# Patient Record
Sex: Female | Born: 1970 | Race: White | Hispanic: No | Marital: Single | Smoking: Never smoker
Health system: Southern US, Community
[De-identification: ages and names within clinical notes are randomized; demographics above are authoritative.]

## PROBLEM LIST (undated history)

## (undated) DIAGNOSIS — F419 Anxiety disorder, unspecified: Secondary | ICD-10-CM

## (undated) DIAGNOSIS — G43909 Migraine, unspecified, not intractable, without status migrainosus: Secondary | ICD-10-CM

## (undated) DIAGNOSIS — I959 Hypotension, unspecified: Secondary | ICD-10-CM

## (undated) DIAGNOSIS — I951 Orthostatic hypotension: Secondary | ICD-10-CM

## (undated) DIAGNOSIS — N898 Other specified noninflammatory disorders of vagina: Secondary | ICD-10-CM

---

## 2010-09-19 HISTORY — PX: HYSTERECTOMY ABDOMINAL WITH SALPINGECTOMY: SHX6725

## 2020-12-05 DIAGNOSIS — B349 Viral infection, unspecified: Secondary | ICD-10-CM | POA: Diagnosis not present

## 2020-12-07 ENCOUNTER — Other Ambulatory Visit (HOSPITAL_COMMUNITY): Payer: Self-pay

## 2020-12-07 DIAGNOSIS — J069 Acute upper respiratory infection, unspecified: Secondary | ICD-10-CM | POA: Diagnosis not present

## 2020-12-28 ENCOUNTER — Ambulatory Visit
Admission: EM | Admit: 2020-12-28 | Discharge: 2020-12-28 | Disposition: A | Discharge status: Home / Self Care | Payer: 59 | Attending: Emergency Medicine | Admitting: Emergency Medicine

## 2020-12-28 ENCOUNTER — Other Ambulatory Visit: Payer: Self-pay

## 2020-12-28 DIAGNOSIS — N3001 Acute cystitis with hematuria: Secondary | ICD-10-CM | POA: Insufficient documentation

## 2020-12-28 DIAGNOSIS — B349 Viral infection, unspecified: Secondary | ICD-10-CM | POA: Insufficient documentation

## 2020-12-28 DIAGNOSIS — R110 Nausea: Secondary | ICD-10-CM | POA: Diagnosis not present

## 2020-12-28 HISTORY — DX: Hypotension, unspecified: I95.9

## 2020-12-28 HISTORY — DX: Migraine, unspecified, not intractable, without status migrainosus: G43.909

## 2020-12-28 HISTORY — DX: Anxiety disorder, unspecified: F41.9

## 2020-12-28 LAB — POCT URINALYSIS DIP (MANUAL ENTRY)
Bilirubin, UA: NEGATIVE
Glucose, UA: NEGATIVE mg/dL
Ketones, POC UA: NEGATIVE mg/dL
Nitrite, UA: NEGATIVE
Protein Ur, POC: NEGATIVE mg/dL
Spec Grav, UA: 1.02 (ref 1.010–1.025)
Urobilinogen, UA: 0.2 E.U./dL
pH, UA: 6 (ref 5.0–8.0)

## 2020-12-28 MED ORDER — CEPHALEXIN 500 MG PO CAPS
500.0000 mg | ORAL_CAPSULE | Freq: Two times a day (BID) | ORAL | 0 refills | Status: AC
  Filled 2020-12-28: qty 10, 5d supply, fill #0

## 2020-12-28 MED ORDER — ALBUTEROL SULFATE HFA 108 (90 BASE) MCG/ACT IN AERS
1.0000 | INHALATION_SPRAY | Freq: Four times a day (QID) | RESPIRATORY_TRACT | 0 refills | Status: DC | PRN
  Filled 2020-12-28: qty 18, 25d supply, fill #0

## 2020-12-28 NOTE — ED Triage Notes (Signed)
Patient presents to Urgent Care with complaints of cough, headache, and nausea x 3 days. Pt states she had a fever 100.8 on Saturday. She states she did notice some hematuria on Thurs/Friday, increased urinary freq. Pt treating symptoms with Mucinex, ibuprofen, and Flonase.

## 2020-12-28 NOTE — Discharge Instructions (Signed)
Use the albuterol inhaler as directed.  Continue taking ibuprofen and Mucinex for your symptoms.    Take the antibiotic as directed.  The urine culture is pending.  We will call you if it shows the need to change or discontinue your antibiotic.    Take the antinausea medication as directed.    Keep yourself hydrated with clear liquids, such as water, Gatorade, Pedialyte, Sprite, or ginger ale.    Follow up with your primary care provider if your symptoms are not improving.

## 2020-12-28 NOTE — ED Provider Notes (Signed)
Caitlin Yu    CSN: [id number] Arrival date & time: 12/28/20  0805      History   Chief Complaint Chief Complaint  Patient presents with  . Cough  . Nausea    HPI Caitlin Yu is a 50 y.o. female.   Patient presents with 3-day history of headache, cough, nausea, hematuria, urinary frequency, low-grade fever.  T-max 100.8.  She denies dysuria, rash, sore throat, shortness of breath, vomiting, diarrhea, other symptoms.  Treatment attempted at home with ibuprofen, Mucinex, Flonase.  Patient was seen at [facility] on 12/05/2020 and diagnosed with acute viral syndrome.  She was seen there again on 12/07/2020; diagnosed with viral URI with cough; treated symptomatically with ibuprofen Zofran; treated with molnupiravir but patient states she did not take this because her COVID test was negative.  Her medical history includes migraines and anxiety.  The history is provided by the patient and medical records.    Past Medical History:  Diagnosis Date  . Anxiety   . Hypotension   . Migraine     There are no problems to display for this patient.   Past Surgical History:  Procedure Laterality Date  . HYSTERECTOMY ABDOMINAL WITH SALPINGECTOMY  2012    OB History   No obstetric history on file.      Home Medications    Prior to Admission medications   Medication Sig Start Date End Date Taking? Authorizing Provider  albuterol (VENTOLIN HFA) 108 (90 Base) MCG/ACT inhaler Inhale 1-2 puffs into the lungs every 6 (six) hours as needed for wheezing or shortness of breath. 12/28/20  Yes Sharion Balloon, NP  Butalbital-APAP-Caffeine [id number] MG capsule TAKE 1 CAPSULE BY MOUTH EVERY 4 HOURS AS NEEDED FOR PAIN 07/15/17  Yes [provider]  cephALEXin (KEFLEX) 500 MG capsule Take 1 capsule (500 mg total) by mouth 2 (two) times daily for 5 days. 12/28/20 01/02/21 Yes Sharion Balloon, NP  estradiol (ESTRACE) 1 MG tablet Take 1 tablet (1 mg total) by mouth once daily 10/21/19  Yes  [provider]  fluticasone (FLONASE) 50 MCG/ACT nasal spray Place into the nose. 06/30/17 10/03/21 Yes [provider]  lisdexamfetamine (VYVANSE) 10 MG capsule Take by mouth. 08/08/17  Yes [provider]  ondansetron (ZOFRAN) 4 MG tablet TAKE 1 TABLET BY MOUTH EVERY 8 HOURS AS NEEDED FOR NAUSEA FOR UP TO 7 DAYS 05/29/17  Yes [provider]  traZODone (DESYREL) 50 MG tablet Take by mouth. 06/19/19  Yes [provider]  Cholecalciferol 25 MCG (1000 UT) tablet Take by mouth.    [provider]  ibuprofen (ADVIL) 800 MG tablet TAKE 1 TABLET (800 MG TOTAL) BY MOUTH EVERY 6 (SIX) HOURS IF NEEDED FOR MILD PAIN FOR UP TO 10 DAYS. 12/07/20 12/07/21    midodrine (PROAMATINE) 5 MG tablet Take 5 mg by mouth 2 (two) times daily. 10/02/20   [provider]  ondansetron (ZOFRAN-ODT) 4 MG disintegrating tablet TAKE 1 TABLET (4 MG TOTAL) BY MOUTH EVERY 8 (EIGHT) HOURS IF NEEDED FOR NAUSEA OR VOMITING FOR UP TO 7 DAYS. 12/07/20 12/07/21      Family History Family History  Problem Relation Age of Onset  . Transient ischemic attack Mother   . Lung cancer Mother   . Neurofibromatosis Father     Social History Social History   Tobacco Use  . Smoking status: Never Smoker  . Smokeless tobacco: Never Used  Vaping Use  . Vaping Use: Never used  Substance Use Topics  .  Alcohol use: Yes    Alcohol/week: 3.0 standard drinks    Types: 3 Glasses of wine per week    Comment: per week   . Drug use: Never     Allergies   Cocamidopropyl betaine, Formaldehyde, Other, Urea, Zolpidem, Atomoxetine, and Chlorhexidine   Review of Systems Review of Systems  Constitutional: Positive for fever. Negative for chills.  HENT: Negative for ear pain and sore throat.   Eyes: Negative for pain and visual disturbance.  Respiratory: Positive for cough. Negative for shortness of breath.   Cardiovascular: Negative for chest pain and palpitations.  Gastrointestinal:  Positive for nausea. Negative for abdominal pain, diarrhea and vomiting.  Genitourinary: Positive for frequency and hematuria. Negative for dysuria.  Musculoskeletal: Negative for arthralgias and back pain.  Skin: Negative for color change and rash.  Neurological: Positive for headaches. Negative for seizures and syncope.  All other systems reviewed and are negative.    Physical Exam Triage Vital Signs ED Triage Vitals  Enc Vitals Group     BP      Pulse      Resp      Temp      Temp src      SpO2      Weight      Height      Head Circumference      Peak Flow      Pain Score      Pain Loc      Pain Edu?      Excl. in [place]?    No data found.  Updated Vital Signs BP 117/81 (BP Location: Left Arm)   Pulse 95   Temp 99.1 F (37.3 C) (Oral)   Resp 18   Wt 145 lb (65.8 kg)   SpO2 98%   Visual Acuity Right Eye Distance:   Left Eye Distance:   Bilateral Distance:    Right Eye Near:   Left Eye Near:    Bilateral Near:     Physical Exam Vitals and nursing note reviewed.  Constitutional:      General: She is not in acute distress.    Appearance: She is well-developed.  HENT:     Head: Normocephalic and atraumatic.     Right Ear: Tympanic membrane normal.     Left Ear: Tympanic membrane normal.     Nose: Nose normal.     Mouth/Throat:     Mouth: Mucous membranes are moist.     Pharynx: Oropharynx is clear.  Eyes:     Conjunctiva/sclera: Conjunctivae normal.  Cardiovascular:     Rate and Rhythm: Normal rate and regular rhythm.     Heart sounds: Normal heart sounds.  Pulmonary:     Effort: Pulmonary effort is normal. No respiratory distress.     Breath sounds: Normal breath sounds.  Abdominal:     General: Bowel sounds are normal.     Palpations: Abdomen is soft.     Tenderness: There is no abdominal tenderness. There is no right CVA tenderness, left CVA tenderness, guarding or rebound.  Musculoskeletal:     Cervical back: Neck supple.  Skin:    General:  Skin is warm and dry.     Findings: No rash.  Neurological:     General: No focal deficit present.     Mental Status: She is alert and oriented to person, place, and time.     Gait: Gait normal.  Psychiatric:        Mood and Affect: Mood  normal.        Behavior: Behavior normal.      UC Treatments / Results  Labs (all labs ordered are listed, but only abnormal results are displayed) Labs Reviewed  POCT URINALYSIS DIP (MANUAL ENTRY) - Abnormal; Notable for the following components:      Result Value   Blood, UA moderate (*)    Leukocytes, UA Small (1+) (*)    All other components within normal limits  URINE CULTURE    EKG   Radiology No results found.  Procedures Procedures (including critical care time)  Medications Ordered in UC Medications - No data to display  Initial Impression / Assessment and Plan / UC Course  I have reviewed the triage vital signs and the nursing notes.  Pertinent labs & imaging results that were available during my care of the patient were reviewed by me and considered in my medical decision making (see chart for details).   Viral illness, acute cystitis with hematuria, nausea without vomiting.  Treating viral symptoms with albuterol inhaler, ibuprofen, Mucinex.  Urine culture pending.  Treating with Keflex.  Discussed with patient that we will call her if the culture shows the need to change or discontinue the antibiotic.  Patient already has Zofran at home and is using this for her nausea.  Instructed her to keep yourself hydrated with clear liquids.  Discussed that she should follow-up with her PCP if her symptoms are not improving.  Patient states she will go through employee health to get a COVID test if required.  She agrees to plan of care.   Final Clinical Impressions(s) / UC Diagnoses   Final diagnoses:  Viral illness  Acute cystitis with hematuria  Nausea without vomiting     Discharge Instructions     Use the albuterol  inhaler as directed.  Continue taking ibuprofen and Mucinex for your symptoms.    Take the antibiotic as directed.  The urine culture is pending.  We will call you if it shows the need to change or discontinue your antibiotic.    Take the antinausea medication as directed.    Keep yourself hydrated with clear liquids, such as water, Gatorade, Pedialyte, Sprite, or ginger ale.    Follow up with your primary care provider if your symptoms are not improving.        ED Prescriptions    Medication Sig Dispense Auth. Provider   albuterol (VENTOLIN HFA) 108 (90 Base) MCG/ACT inhaler Inhale 1-2 puffs into the lungs every 6 (six) hours as needed for wheezing or shortness of breath. 18 g Sharion Balloon, NP   cephALEXin (KEFLEX) 500 MG capsule Take 1 capsule (500 mg total) by mouth 2 (two) times daily for 5 days. 10 capsule Sharion Balloon, NP     PDMP not reviewed this encounter.   Sharion Balloon, NP 12/28/20 [phone]

## 2020-12-29 ENCOUNTER — Emergency Department
Admission: EM | Admit: 2020-12-29 | Discharge: 2020-12-29 | Disposition: A | Discharge status: Home / Self Care | Payer: 59 | Attending: Emergency Medicine | Admitting: Emergency Medicine

## 2020-12-29 ENCOUNTER — Emergency Department: Payer: 59

## 2020-12-29 ENCOUNTER — Ambulatory Visit: Payer: Self-pay | Admitting: *Deleted

## 2020-12-29 ENCOUNTER — Other Ambulatory Visit: Payer: Self-pay

## 2020-12-29 ENCOUNTER — Encounter: Payer: Self-pay | Admitting: *Deleted

## 2020-12-29 DIAGNOSIS — R531 Weakness: Secondary | ICD-10-CM | POA: Diagnosis not present

## 2020-12-29 DIAGNOSIS — Z20822 Contact with and (suspected) exposure to covid-19: Secondary | ICD-10-CM | POA: Diagnosis not present

## 2020-12-29 DIAGNOSIS — R0602 Shortness of breath: Secondary | ICD-10-CM | POA: Diagnosis not present

## 2020-12-29 DIAGNOSIS — J40 Bronchitis, not specified as acute or chronic: Secondary | ICD-10-CM | POA: Diagnosis not present

## 2020-12-29 LAB — BASIC METABOLIC PANEL
Anion gap: 11 (ref 5–15)
BUN: 14 mg/dL (ref 6–20)
CO2: 22 mmol/L (ref 22–32)
Calcium: 9.8 mg/dL (ref 8.9–10.3)
Chloride: 105 mmol/L (ref 98–111)
Creatinine, Ser: 0.91 mg/dL (ref 0.44–1.00)
GFR, Estimated: >60 mL/min (ref >60)
Glucose, Bld: 95 mg/dL (ref 70–99)
Potassium: 3.6 mmol/L (ref 3.5–5.1)
Sodium: 138 mmol/L (ref 135–145)

## 2020-12-29 LAB — CBC
HCT: 39.9 % (ref 36.0–46.0)
Hemoglobin: 14 g/dL (ref 12.0–15.0)
MCH: 31.2 pg (ref 26.0–34.0)
MCHC: 35.1 g/dL (ref 30.0–36.0)
MCV: 88.9 fL (ref 80.0–100.0)
Platelets: 243 10*3/uL (ref 150–400)
RBC: 4.49 MIL/uL (ref 3.87–5.11)
RDW: 12.2 % (ref 11.5–15.5)
WBC: 4.6 10*3/uL (ref 4.0–10.5)
nRBC: 0 % (ref 0.0–0.2)

## 2020-12-29 LAB — TROPONIN I (HIGH SENSITIVITY): Troponin I (High Sensitivity): 3 ng/L (ref <18)

## 2020-12-29 MED ORDER — ALBUTEROL SULFATE HFA 108 (90 BASE) MCG/ACT IN AERS
2.0000 | INHALATION_SPRAY | Freq: Four times a day (QID) | RESPIRATORY_TRACT | 0 refills | Status: DC | PRN
  Filled 2020-12-29 – 2021-05-25 (×2): qty 18, 25d supply, fill #0

## 2020-12-29 MED ORDER — IPRATROPIUM-ALBUTEROL 0.5-2.5 (3) MG/3ML IN SOLN
3.0000 mL | Freq: Once | RESPIRATORY_TRACT | Status: AC
  Administered 2020-12-29: 3 mL via RESPIRATORY_TRACT
  Filled 2020-12-29: qty 3

## 2020-12-29 MED ORDER — ONDANSETRON 4 MG PO TBDP
4.0000 mg | ORAL_TABLET | Freq: Once | ORAL | Status: AC
  Administered 2020-12-29: 4 mg via ORAL
  Filled 2020-12-29: qty 1

## 2020-12-29 NOTE — ED Triage Notes (Signed)
Pt reports sob for 5 days.  Pt was seen at urgent care yesterday treated for viral infection.  Pt has a cough.  Nonsmoker.  No chest pain.  Pt reports nausea.  No v/d  Pt alert

## 2020-12-29 NOTE — Telephone Encounter (Signed)
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 She had viral illness with negative /suspect COVID 2-3 weeks ago- she is now sick again. Patient is calling to schedule NP appointment- she is very out of breath.  Patient advised ED now- call 911 if unsafe to drive.  Reason for Disposition . [1] MODERATE difficulty breathing (e.g., speaks in phrases, SOB even at rest, pulse 100-120) AND [2] NEW-onset or WORSE than normal  Answer Assessment - Initial Assessment Questions 1. RESPIRATORY STATUS: "Describe your breathing?" (e.g., wheezing, shortness of breath, unable to speak, severe coughing)      Patient sounds out of breath 2. ONSET: "When did this breathing problem begin?"      2-3 weeks ago- 2 episodes of -bronchitis/asthma 3. PATTERN "Does the difficult breathing come and go, or has it been constant since it started?"      constant 4. SEVERITY: "How bad is your breathing?" (e.g., mild, moderate, severe)    - MILD: No SOB at rest, mild SOB with walking, speaks normally in sentences, can lay down, no retractions, pulse < 100.    - MODERATE: SOB at rest, SOB with minimal exertion and prefers to sit, cannot lie down flat, speaks in phrases, mild retractions, audible wheezing, pulse 100-120.    - SEVERE: Very SOB at rest, speaks in single words, struggling to breathe, sitting hunched forward, retractions, pulse > 120      moderate 5. RECURRENT SYMPTOM: "Have you had difficulty breathing before?" If Yes, ask: "When was the last time?" and "What happened that time?"      Hx asthma/bronchitis- treatment with inhaler 6. CARDIAC HISTORY: "Do you have any history of heart disease?" (e.g., heart attack, angina, bypass surgery, angioplasty)      no 7. LUNG HISTORY: "Do you have any history of lung disease?"  (e.g., pulmonary embolus, asthma, emphysema)     Hx asthma 8. CAUSE: "What do you think is causing the breathing problem?"      Unsure- no test at UC 9. OTHER  SYMPTOMS: "Do you have any other symptoms? (e.g., dizziness, runny nose, cough, chest pain, fever)     dizziness 10. PREGNANCY: "Is there any chance you are pregnant?" "When was your last menstrual period?"       n/a 11. TRAVEL: "Have you traveled out of the country in the last month?" (e.g., travel history, exposures)       Works at hospital  Protocols used: BREATHING DIFFICULTY-A-AH

## 2020-12-29 NOTE — ED Provider Notes (Signed)
[facility] Provider Note   ____________________________________________   Event Date/Time   First MD Initiated Contact with Patient 12/29/20 1540     (approximate)  I have reviewed the triage vital signs and the nursing notes.   HISTORY  Chief Complaint Shortness of Breath    HPI Caitlin Yu is a 50 y.o. female with a past medical history of anxiety, migraines, and hypotension on midodrine who presents to the ED complaining of shortness of breath.  Patient reports that for the past 5 days she has had increasing dry cough, nausea, generalized weakness, difficulty catching her breath.  She had fevers as high as 101 at the start of her illness, but denies any fevers in the past 24 hours.  She has not had any pain in her chest and denies any vomiting, diarrhea, abdominal pain, dysuria, or flank pain.  She was seen at urgent care yesterday and diagnosed with "viral infection" and she states that she is waiting to hear the results back from her Covid test.  She has been using her inhaler without significant relief.        Past Medical History:  Diagnosis Date  . Anxiety   . Hypotension   . Migraine     There are no problems to display for this patient.   Past Surgical History:  Procedure Laterality Date  . HYSTERECTOMY ABDOMINAL WITH SALPINGECTOMY  2012    Prior to Admission medications   Medication Sig Start Date End Date Taking? Authorizing Provider  albuterol (VENTOLIN HFA) 108 (90 Base) MCG/ACT inhaler Inhale 2 puffs into the lungs every 6 (six) hours as needed for wheezing or shortness of breath. 12/29/20  Yes Blake Divine, MD  Butalbital-APAP-Caffeine [phone] MG capsule TAKE 1 CAPSULE BY MOUTH EVERY 4 HOURS AS NEEDED FOR PAIN 07/15/17   [provider]  cephALEXin (KEFLEX) 500 MG capsule Take 1 capsule (500 mg total) by mouth 2 (two) times daily for 5 days. 12/28/20 01/02/21  Sharion Balloon, NP  Cholecalciferol 25  MCG (1000 UT) tablet Take by mouth.    [provider]  estradiol (ESTRACE) 1 MG tablet Take 1 tablet (1 mg total) by mouth once daily 10/21/19   [provider]  fluticasone (FLONASE) 50 MCG/ACT nasal spray Place into the nose. 06/30/17 10/03/21  [provider]  ibuprofen (ADVIL) 800 MG tablet TAKE 1 TABLET (800 MG TOTAL) BY MOUTH EVERY 6 (SIX) HOURS IF NEEDED FOR MILD PAIN FOR UP TO 10 DAYS. 12/07/20 12/07/21    lisdexamfetamine (VYVANSE) 10 MG capsule Take by mouth. 08/08/17   [provider]  midodrine (PROAMATINE) 5 MG tablet Take 5 mg by mouth 2 (two) times daily. 10/02/20   [provider]  ondansetron (ZOFRAN) 4 MG tablet TAKE 1 TABLET BY MOUTH EVERY 8 HOURS AS NEEDED FOR NAUSEA FOR UP TO 7 DAYS 05/29/17   [provider]  ondansetron (ZOFRAN-ODT) 4 MG disintegrating tablet TAKE 1 TABLET (4 MG TOTAL) BY MOUTH EVERY 8 (EIGHT) HOURS IF NEEDED FOR NAUSEA OR VOMITING FOR UP TO 7 DAYS. 12/07/20 12/07/21    traZODone (DESYREL) 50 MG tablet Take by mouth. 06/19/19   [provider]    Allergies Cocamidopropyl betaine, Formaldehyde, Other, Urea, Zolpidem, Atomoxetine, and Chlorhexidine  Family History  Problem Relation Age of Onset  . Transient ischemic attack Mother   . Lung cancer Mother   . Neurofibromatosis Father     Social History Social History   Tobacco Use  .  Smoking status: Never Smoker  . Smokeless tobacco: Never Used  Vaping Use  . Vaping Use: Never used  Substance Use Topics  . Alcohol use: Yes    Alcohol/week: 3.0 standard drinks    Types: 3 Glasses of wine per week    Comment: per week   . Drug use: Never    Review of Systems  Constitutional: Positive for fever and generalized weakness. Eyes: No visual changes. ENT: No sore throat. Cardiovascular: Denies chest pain. Respiratory: Positive for cough and shortness of breath. Gastrointestinal: No abdominal pain.  Positive for nausea, no vomiting.  No  diarrhea.  No constipation. Genitourinary: Negative for dysuria. Musculoskeletal: Negative for back pain. Skin: Negative for rash. Neurological: Negative for headaches, focal weakness or numbness.  ____________________________________________   PHYSICAL EXAM:  VITAL SIGNS: ED Triage Vitals [12/29/20 1527]  Enc Vitals Group     BP 126/82     Pulse Rate 90     Resp (!) 24     Temp 98.2 F (36.8 C)     Temp Source Oral     SpO2 100 %     Weight 145 lb (65.8 kg)     Height 5\' 7"  (1.702 m)     Head Circumference      Peak Flow      Pain Score 0     Pain Loc      Pain Edu?      Excl. in [place]?     Constitutional: Alert and oriented. Eyes: Conjunctivae are normal. Head: Atraumatic. Nose: No congestion/rhinnorhea. Mouth/Throat: Mucous membranes are moist.  Hoarse voice noted. Neck: Normal ROM Cardiovascular: Normal rate, regular rhythm. Grossly normal heart sounds. Respiratory: Normal respiratory effort.  No retractions. Lungs CTAB. Gastrointestinal: Soft and nontender. No distention. Genitourinary: deferred Musculoskeletal: No lower extremity tenderness nor edema. Neurologic:  Normal speech and language. No gross focal neurologic deficits are appreciated. Skin:  Skin is warm, dry and intact. No rash noted. Psychiatric: Mood and affect are normal. Speech and behavior are normal.  ____________________________________________   LABS (all labs ordered are listed, but only abnormal results are displayed)  Labs Reviewed  BASIC METABOLIC PANEL  CBC  TROPONIN I (HIGH SENSITIVITY)   ____________________________________________  EKG  ED ECG REPORT I, Blake Divine, the attending physician, personally viewed and interpreted this ECG.   Date: 12/29/2020  EKG Time: 15:30  Rate: 88  Rhythm: normal sinus rhythm  Axis: Normal  Intervals:none  ST&T Change: None   PROCEDURES  Procedure(s) performed (including Critical  Care):  Procedures   ____________________________________________   INITIAL IMPRESSION / ASSESSMENT AND PLAN / ED COURSE       50 year old female with past medical history of anxiety, migraines, and hypertension who presents to the ED with cough, nausea, malaise, and increasing difficulty catching her breath over the past 4 to 5 days.  Patient is overall well-appearing and not in any respiratory distress, lungs clear to auscultation bilaterally.  Chest x-ray reviewed by me and shows no infiltrate, edema, or effusion.  EKG shows no evidence of arrhythmia or ischemia and I doubt ACS or PE as she is PERC negative.  We will treat symptomatically with Zofran and DuoNeb, but I suspect her symptoms are due to viral illness and bronchitis.  Lab work is unremarkable, troponin within normal limits.  I doubt ACS or PE given her symptoms are consistent with a viral infection.  Patient reports feeling better following DuoNeb and she is appropriate for discharge home with PCP follow-up.  She states she has Zofran available at home but is requesting refill on her inhaler.  She was counseled to return to the ED for new worsening symptoms, patient agrees with plan.      ____________________________________________   FINAL CLINICAL IMPRESSION(S) / ED DIAGNOSES  Final diagnoses:  Bronchitis     ED Discharge Orders         Ordered    albuterol (VENTOLIN HFA) 108 (90 Base) MCG/ACT inhaler  Every 6 hours PRN       Note to Pharmacy: Please supply with spacer   12/29/20 1729           Note:  This document was prepared using Dragon voice recognition software and may include unintentional dictation errors.   Blake Divine, MD 12/29/20 1731

## 2020-12-30 ENCOUNTER — Other Ambulatory Visit: Payer: Self-pay

## 2020-12-30 DIAGNOSIS — J209 Acute bronchitis, unspecified: Secondary | ICD-10-CM | POA: Diagnosis not present

## 2020-12-30 DIAGNOSIS — B9689 Other specified bacterial agents as the cause of diseases classified elsewhere: Secondary | ICD-10-CM | POA: Diagnosis not present

## 2020-12-30 DIAGNOSIS — J019 Acute sinusitis, unspecified: Secondary | ICD-10-CM | POA: Diagnosis not present

## 2020-12-30 LAB — URINE CULTURE

## 2020-12-30 LAB — SARS CORONAVIRUS 2 (TAT 6-24 HRS): SARS Coronavirus 2: NEGATIVE

## 2020-12-31 ENCOUNTER — Other Ambulatory Visit: Payer: Self-pay

## 2020-12-31 MED ORDER — AZITHROMYCIN 250 MG PO TABS
ORAL_TABLET | ORAL | 0 refills | Status: DC
  Filled 2020-12-31: qty 6, 5d supply, fill #0

## 2020-12-31 MED ORDER — METHYLPREDNISOLONE 4 MG PO TBPK
ORAL_TABLET | ORAL | 0 refills | Status: AC
  Filled 2020-12-31: qty 21, 6d supply, fill #0

## 2020-12-31 MED ORDER — BENZONATATE 200 MG PO CAPS
ORAL_CAPSULE | ORAL | 0 refills | Status: DC
  Filled 2020-12-31: qty 20, 7d supply, fill #0

## 2021-01-05 DIAGNOSIS — Z20822 Contact with and (suspected) exposure to covid-19: Secondary | ICD-10-CM | POA: Diagnosis not present

## 2021-01-05 DIAGNOSIS — Z03818 Encounter for observation for suspected exposure to other biological agents ruled out: Secondary | ICD-10-CM | POA: Diagnosis not present

## 2021-01-05 DIAGNOSIS — R499 Unspecified voice and resonance disorder: Secondary | ICD-10-CM | POA: Diagnosis not present

## 2021-01-11 DIAGNOSIS — R490 Dysphonia: Secondary | ICD-10-CM | POA: Diagnosis not present

## 2021-01-14 ENCOUNTER — Ambulatory Visit: Payer: 59 | Admitting: Internal Medicine

## 2021-01-15 ENCOUNTER — Other Ambulatory Visit: Payer: Self-pay

## 2021-01-15 DIAGNOSIS — I951 Orthostatic hypotension: Secondary | ICD-10-CM | POA: Diagnosis not present

## 2021-01-15 DIAGNOSIS — R110 Nausea: Secondary | ICD-10-CM | POA: Diagnosis not present

## 2021-01-15 DIAGNOSIS — F5102 Adjustment insomnia: Secondary | ICD-10-CM | POA: Diagnosis not present

## 2021-01-15 DIAGNOSIS — F418 Other specified anxiety disorders: Secondary | ICD-10-CM | POA: Diagnosis not present

## 2021-01-15 MED ORDER — TRAZODONE HCL 50 MG PO TABS
ORAL_TABLET | ORAL | 11 refills | Status: DC
  Filled 2021-01-15: qty 30, 30d supply, fill #0

## 2021-01-15 MED ORDER — SERTRALINE HCL 50 MG PO TABS
1.0000 | ORAL_TABLET | Freq: Every day | ORAL | 11 refills | Status: DC
  Filled 2021-01-15: qty 30, 30d supply, fill #0
  Filled 2021-02-22: qty 30, 30d supply, fill #1

## 2021-01-15 MED ORDER — MIDODRINE HCL 5 MG PO TABS
5.0000 mg | ORAL_TABLET | Freq: Two times a day (BID) | ORAL | 3 refills | Status: DC
  Filled 2021-01-15: qty 180, 90d supply, fill #0
  Filled 2021-07-26: qty 180, 90d supply, fill #1

## 2021-01-18 ENCOUNTER — Other Ambulatory Visit: Payer: Self-pay

## 2021-01-20 ENCOUNTER — Other Ambulatory Visit: Payer: Self-pay

## 2021-01-27 DIAGNOSIS — R300 Dysuria: Secondary | ICD-10-CM | POA: Diagnosis not present

## 2021-01-27 DIAGNOSIS — N390 Urinary tract infection, site not specified: Secondary | ICD-10-CM | POA: Diagnosis not present

## 2021-02-03 ENCOUNTER — Other Ambulatory Visit: Payer: Self-pay | Admitting: Family Medicine

## 2021-02-03 ENCOUNTER — Ambulatory Visit
Admission: RE | Admit: 2021-02-03 | Discharge: 2021-02-03 | Disposition: A | Discharge status: Home / Self Care | Payer: 59 | Source: Ambulatory Visit | Attending: Family Medicine | Admitting: Family Medicine

## 2021-02-03 ENCOUNTER — Other Ambulatory Visit: Payer: Self-pay

## 2021-02-03 DIAGNOSIS — R1032 Left lower quadrant pain: Secondary | ICD-10-CM | POA: Diagnosis not present

## 2021-02-03 DIAGNOSIS — R310 Gross hematuria: Secondary | ICD-10-CM | POA: Diagnosis not present

## 2021-02-03 DIAGNOSIS — N200 Calculus of kidney: Secondary | ICD-10-CM | POA: Diagnosis not present

## 2021-02-03 DIAGNOSIS — D1771 Benign lipomatous neoplasm of kidney: Secondary | ICD-10-CM | POA: Diagnosis not present

## 2021-02-03 DIAGNOSIS — N2889 Other specified disorders of kidney and ureter: Secondary | ICD-10-CM | POA: Diagnosis not present

## 2021-02-03 DIAGNOSIS — R109 Unspecified abdominal pain: Secondary | ICD-10-CM | POA: Diagnosis not present

## 2021-02-04 ENCOUNTER — Ambulatory Visit (INDEPENDENT_AMBULATORY_CARE_PROVIDER_SITE_OTHER): Payer: 59 | Admitting: Urology

## 2021-02-04 ENCOUNTER — Encounter: Payer: Self-pay | Admitting: Urology

## 2021-02-04 VITALS — BP 114/72 | HR 55 | Ht 67.0 in | Wt 141.0 lb

## 2021-02-04 DIAGNOSIS — R310 Gross hematuria: Secondary | ICD-10-CM

## 2021-02-04 DIAGNOSIS — R8281 Pyuria: Secondary | ICD-10-CM | POA: Diagnosis not present

## 2021-02-04 DIAGNOSIS — N200 Calculus of kidney: Secondary | ICD-10-CM

## 2021-02-04 NOTE — Progress Notes (Signed)
02/04/2021 2:52 PM   Caitlin Yu March 14, 1971 [id number]  Referring provider: Leonel Ramsay, MD [place],  [place] [address]  Chief Complaint  Patient presents with  . Nephrolithiasis    HPI: Caitlin Yu is a 50 y.o. female referred for evaluation of nephrolithiasis.   Was seen at [facility] on 01/27/2021 with complaints of mild, intermittent dysuria and brownish colored urine with mild suprapubic pain  Slight increased frequency and urgency  No fever, chills, nausea or vomiting  Urinalysis showed >50 RBCs and 0 WBCs  She was started on cefdinir however urine culture was negative  She develop worsening gross hematuria yesterday describing her urine is red in color and had mild left lower quadrant abdominal pain; repeat urinalysis with 10-50 RBCs and no WBCs  Stone protocol CT abdomen pelvis was performed which showed a nonobstructing 4 mm right lower pole calculus and 2 1-2 millimeter nonobstructing left renal calculi.  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 Anxiety   . Hypotension   . Migraine     Surgical History: Past Surgical History:  Procedure Laterality Date  . HYSTERECTOMY ABDOMINAL WITH SALPINGECTOMY  2012    Home Medications:  Allergies as of 02/04/2021      Reactions   Cocamidopropyl Betaine Other (See Comments)   Other reaction(s): Other (See Comments) Positive patch test Other reaction(s): Other (See Comments) Other reaction(s): Other (See  Comments) Positive patch test Positive patch test Other reaction(s): Other (See Comments) Positive patch test Other reaction(s): Other (See Comments) Other reaction(s): Other (See Comments) Positive patch test Positive patch test Positive patch test Positive patch test   Formaldehyde Other (See Comments)   Other reaction(s): Other (See Comments) Positive patch test Other reaction(s): Other (See Comments) Other reaction(s): Other (See Comments) Positive patch test Positive patch test Other reaction(s): Other (See Comments) Positive patch test Other reaction(s): Other (See Comments) Other reaction(s): Other (See Comments) Positive patch test Positive patch test Positive patch test Positive patch test   Other Other (See Comments)   Other reaction(s): Other (See Comments) Para-phenylenediamine = Positive patch test amidoamine = Positive patch test Para-phenylenediamine = Positive patch test amidoamine = Positive patch test   Urea Other (See Comments)   Other reaction(s): Other (See Comments) Positive patch test Other reaction(s): Other (See Comments) Other reaction(s): Other (See Comments) Positive patch test Positive patch test Other reaction(s): Other (See Comments) Positive patch test Other reaction(s): Other (See Comments) Other reaction(s): Other (See Comments) Positive patch test Positive patch test Positive patch test Positive patch test   Zolpidem Other (See Comments)   Sleep Walking Other reaction(s): Other (See Comments), Other (See Comments) Sleep Walking Sleep Walking Other reaction(s): Other (See Comments) Sleep Walking Other reaction(s): Other (See Comments), Other (See Comments) Sleep Walking Sleep Walking Sleep Walking Sleep Walking   Atomoxetine Palpitations   Chlorhexidine Rash   allergic contact dermatitis allergic contact dermatitis allergic contact dermatitis allergic contact dermatitis allergic contact dermatitis allergic contact  dermatitis allergic  contact dermatitis allergic contact dermatitis      Medication List       Accurate as of Feb 04, 2021  2:52 PM. If you have any questions, ask your nurse or doctor.        albuterol 108 (90 Base) MCG/ACT inhaler Commonly known as: VENTOLIN HFA Inhale 2 puffs into the lungs every 6 (six) hours as needed for wheezing or shortness of breath.   azithromycin 250 MG tablet Commonly known as: ZITHROMAX take 2 tablets by mouth today, then one tablet daily for 4 days (Two tabs today, then one daily)   benzonatate 200 MG capsule Commonly known as: TESSALON Take 1 capsule (200 mg total) by mouth 3 (three) times daily as needed for Cough for up to 7 days   Butalbital-APAP-Caffeine 50-325-40 MG capsule TAKE 1 CAPSULE BY MOUTH EVERY 4 HOURS AS NEEDED FOR PAIN   Cholecalciferol 25 MCG (1000 UT) tablet Take by mouth.   estradiol 1 MG tablet Commonly known as: ESTRACE Take 1 tablet (1 mg total) by mouth once daily   fluticasone 50 MCG/ACT nasal spray Commonly known as: FLONASE Place into the nose.   ibuprofen 800 MG tablet Commonly known as: ADVIL TAKE 1 TABLET (800 MG TOTAL) BY MOUTH EVERY 6 (SIX) HOURS IF NEEDED FOR MILD PAIN FOR UP TO 10 DAYS.   lisdexamfetamine 10 MG capsule Commonly known as: VYVANSE Take by mouth.   methylPREDNISolone 4 MG Tbpk tablet Commonly known as: MEDROL DOSEPAK Follow package directions.   midodrine 5 MG tablet Commonly known as: PROAMATINE Take 5 mg by mouth 2 (two) times daily.   midodrine 5 MG tablet Commonly known as: PROAMATINE Take 1 tablet (5 mg total) by mouth 2 (two) times daily   ondansetron 4 MG disintegrating tablet Commonly known as: ZOFRAN-ODT TAKE 1 TABLET (4 MG TOTAL) BY MOUTH EVERY 8 (EIGHT) HOURS IF NEEDED FOR NAUSEA OR VOMITING FOR UP TO 7 DAYS.   ondansetron 4 MG tablet Commonly known as: ZOFRAN TAKE 1 TABLET BY MOUTH EVERY 8 HOURS AS NEEDED FOR NAUSEA FOR UP TO 7 DAYS   sertraline 50 MG  tablet Commonly known as: ZOLOFT Take 1 tablet (50 mg total) by mouth once daily   traZODone 50 MG tablet Commonly known as: DESYREL Take by mouth.   traZODone 50 MG tablet Commonly known as: DESYREL Take 1 tablet (50 mg total) by mouth nightly       Allergies:  Allergies  Allergen Reactions  . Cocamidopropyl Betaine Other (See Comments)    Other reaction(s): Other (See Comments) Positive patch test Other reaction(s): Other (See Comments) Other reaction(s): Other (See Comments) Positive patch test Positive patch test Other reaction(s): Other (See Comments) Positive patch test Other reaction(s): Other (See Comments) Other reaction(s): Other (See Comments) Positive patch test Positive patch test Positive patch test Positive patch test   . Formaldehyde Other (See Comments)    Other reaction(s): Other (See Comments) Positive patch test Other reaction(s): Other (See Comments) Other reaction(s): Other (See Comments) Positive patch test Positive patch test Other reaction(s): Other (See Comments) Positive patch test Other reaction(s): Other (See Comments) Other reaction(s): Other (See Comments) Positive patch test Positive patch test Positive patch test Positive patch test   . Other Other (See Comments)    Other reaction(s): Other (See Comments) Para-phenylenediamine = Positive patch test amidoamine = Positive patch test Para-phenylenediamine = Positive patch test amidoamine = Positive patch test   . Urea Other (See Comments)    Other reaction(s): Other (  See Comments) Positive patch test Other reaction(s): Other (See Comments) Other reaction(s): Other (See Comments) Positive patch test Positive patch test Other reaction(s): Other (See Comments) Positive patch test Other reaction(s): Other (See Comments) Other reaction(s): Other (See Comments) Positive patch test Positive patch test Positive patch test Positive patch test   . Zolpidem Other (See  Comments)    Sleep Walking Other reaction(s): Other (See Comments), Other (See Comments) Sleep Walking Sleep Walking Other reaction(s): Other (See Comments) Sleep Walking Other reaction(s): Other (See Comments), Other (See Comments) Sleep Walking Sleep Walking Sleep Walking Sleep Walking   . Atomoxetine Palpitations  . Chlorhexidine Rash    allergic contact dermatitis allergic contact dermatitis allergic contact dermatitis allergic contact dermatitis allergic contact dermatitis allergic contact dermatitis allergic contact dermatitis allergic contact dermatitis     Family History: Family History  Problem Relation Age of Onset  . Transient ischemic attack Mother   . Lung cancer Mother   . Neurofibromatosis Father     Social History:  reports that she has never smoked. She has never used smokeless tobacco. She reports current alcohol use of about 3.0 standard drinks of alcohol per week. She reports that she does not use drugs.   Physical Exam: There were no vitals taken for this visit.  Constitutional:  Alert and oriented, No acute distress. HEENT: [place] AT, moist mucus membranes.  Trachea midline, no masses. Cardiovascular: No clubbing, cyanosis, or edema. Respiratory: Normal respiratory effort, no increased work of breathing. Skin: No rashes, bruises or suspicious lesions. Neurologic: Grossly intact, no focal deficits, moving all 4 extremities. Psychiatric: Normal mood and affect.  Laboratory Data:  Urinalysis Microscopy 6-10 WBC, 0 RBC   Pertinent Imaging: CT images personally reviewed and interpreted   Assessment & Plan:    1.  Gross hematuria  Recent urine culture negative  Unlikely the nonobstructing renal calculi would be responsible for gross hematuria  No definite ureteral calculi were seen however difficult to adequately visualize due to minimal retroperitoneal fat  I discussed the recommended evaluation for high risk hematuria of a CT urogram  and cystoscopy  We discussed recent shortage of IV contrast and since a noncontrast CT has been performed will schedule cystoscopy first and CT urogram if no significant abnormalities are noted on cystoscopy  Urinalysis today did show pyuria and no microhematuria and urine culture repeated  All questions were answered and she desires to proceed    Abbie Sons, MD  [facility] [address], [place], [place] [address] [phone]

## 2021-02-05 LAB — URINALYSIS, COMPLETE
Bilirubin, UA: NEGATIVE
Glucose, UA: NEGATIVE
Ketones, UA: NEGATIVE
Nitrite, UA: NEGATIVE
Protein,UA: NEGATIVE
RBC, UA: NEGATIVE
Specific Gravity, UA: 1.015 (ref 1.005–1.030)
Urobilinogen, Ur: 0.2 mg/dL (ref 0.2–1.0)
pH, UA: 7 (ref 5.0–7.5)

## 2021-02-05 LAB — MICROSCOPIC EXAMINATION: Bacteria, UA: NONE SEEN

## 2021-02-08 ENCOUNTER — Other Ambulatory Visit: Payer: Self-pay

## 2021-02-08 DIAGNOSIS — R935 Abnormal findings on diagnostic imaging of other abdominal regions, including retroperitoneum: Secondary | ICD-10-CM | POA: Diagnosis not present

## 2021-02-08 DIAGNOSIS — Z1211 Encounter for screening for malignant neoplasm of colon: Secondary | ICD-10-CM | POA: Diagnosis not present

## 2021-02-08 LAB — CULTURE, URINE COMPREHENSIVE

## 2021-02-08 MED ORDER — SUPREP BOWEL PREP KIT 17.5-3.13-1.6 GM/177ML PO SOLN
ORAL | 0 refills | Status: DC
  Filled 2021-02-08: qty 354, 1d supply, fill #0

## 2021-02-08 MED ORDER — HYOSCYAMINE SULFATE 0.125 MG SL SUBL
SUBLINGUAL_TABLET | SUBLINGUAL | 0 refills | Status: DC
  Filled 2021-02-08: qty 30, 7d supply, fill #0

## 2021-02-09 ENCOUNTER — Encounter: Payer: Self-pay | Admitting: *Deleted

## 2021-02-22 ENCOUNTER — Other Ambulatory Visit: Payer: Self-pay

## 2021-02-23 ENCOUNTER — Other Ambulatory Visit: Payer: Self-pay

## 2021-02-25 ENCOUNTER — Other Ambulatory Visit: Payer: Self-pay

## 2021-02-25 ENCOUNTER — Encounter: Payer: Self-pay | Admitting: Urology

## 2021-02-25 ENCOUNTER — Ambulatory Visit: Payer: 59 | Admitting: Urology

## 2021-02-25 VITALS — BP 102/68 | HR 54 | Ht 67.0 in | Wt 142.0 lb

## 2021-02-25 DIAGNOSIS — R310 Gross hematuria: Secondary | ICD-10-CM | POA: Diagnosis not present

## 2021-02-25 LAB — URINALYSIS, COMPLETE
Bilirubin, UA: NEGATIVE
Glucose, UA: NEGATIVE
Ketones, UA: NEGATIVE
Nitrite, UA: NEGATIVE
Protein,UA: NEGATIVE
Specific Gravity, UA: 1.01 (ref 1.005–1.030)
Urobilinogen, Ur: 0.2 mg/dL (ref 0.2–1.0)
pH, UA: 6.5 (ref 5.0–7.5)

## 2021-02-25 LAB — MICROSCOPIC EXAMINATION: Bacteria, UA: NONE SEEN

## 2021-02-25 NOTE — Progress Notes (Signed)
   02/25/21  CC:  Chief Complaint  Patient presents with   Cysto    HPI: Intermittent gross hematuria.  See my prior note 02/04/2021.  She did have a episode gross hematuria earlier this week and the urine was described as pinkish-red in color  Blood pressure 102/68, pulse (!) 54, height 5\' 7"  (1.702 m), weight 142 lb (64.4 kg). NED. A&Ox3.   No respiratory distress   Abd soft, NT, ND Normal external genitalia with patent urethral meatus  Cystoscopy Procedure Note  Patient identification was confirmed, informed consent was obtained, and patient was prepped using Betadine solution.  Lidocaine jelly was administered per urethral meatus.    Procedure: - Flexible cystoscope introduced, without any difficulty.   - Thorough search of the bladder revealed:    normal urethral meatus    normal urothelium    no stones    no ulcers     no tumors    no urethral polyps    no trabeculation  - Ureteral orifices were normal in position and appearance.  Post-Procedure: - Patient tolerated the procedure well  Assessment/ Plan: No abnormalities noted on cystoscopy CT ordered by PCP was a noncontrast study Since she is having continued intermittent gross hematuria will order CTU   Abbie Sons, MD

## 2021-03-03 ENCOUNTER — Other Ambulatory Visit: Payer: Self-pay

## 2021-03-03 DIAGNOSIS — G43809 Other migraine, not intractable, without status migrainosus: Secondary | ICD-10-CM | POA: Diagnosis not present

## 2021-03-03 DIAGNOSIS — Z780 Asymptomatic menopausal state: Secondary | ICD-10-CM | POA: Diagnosis not present

## 2021-03-03 DIAGNOSIS — N952 Postmenopausal atrophic vaginitis: Secondary | ICD-10-CM | POA: Diagnosis not present

## 2021-03-03 DIAGNOSIS — F418 Other specified anxiety disorders: Secondary | ICD-10-CM | POA: Diagnosis not present

## 2021-03-03 MED ORDER — SERTRALINE HCL 50 MG PO TABS
ORAL_TABLET | ORAL | 3 refills | Status: AC
  Filled 2021-03-03 – 2021-03-26 (×2): qty 45, 30d supply, fill #0
  Filled 2021-05-13: qty 45, 30d supply, fill #1
  Filled 2021-07-26: qty 45, 30d supply, fill #2
  Filled 2021-09-10: qty 45, 30d supply, fill #3

## 2021-03-03 MED ORDER — ESTRADIOL 1 MG PO TABS
ORAL_TABLET | ORAL | 3 refills | Status: DC
  Filled 2021-03-03: qty 30, 30d supply, fill #0
  Filled 2021-04-14: qty 30, 30d supply, fill #1
  Filled 2021-05-13: qty 30, 30d supply, fill #2
  Filled 2021-06-29: qty 30, 30d supply, fill #3

## 2021-03-10 ENCOUNTER — Other Ambulatory Visit: Payer: Self-pay

## 2021-03-24 ENCOUNTER — Other Ambulatory Visit: Payer: Self-pay

## 2021-03-24 ENCOUNTER — Ambulatory Visit
Admission: RE | Admit: 2021-03-24 | Discharge: 2021-03-24 | Disposition: A | Discharge status: Home / Self Care | Payer: 59 | Source: Ambulatory Visit | Attending: Urology | Admitting: Urology

## 2021-03-24 DIAGNOSIS — R310 Gross hematuria: Secondary | ICD-10-CM | POA: Insufficient documentation

## 2021-03-24 DIAGNOSIS — D1771 Benign lipomatous neoplasm of kidney: Secondary | ICD-10-CM | POA: Diagnosis not present

## 2021-03-24 DIAGNOSIS — N200 Calculus of kidney: Secondary | ICD-10-CM | POA: Diagnosis not present

## 2021-03-24 DIAGNOSIS — I878 Other specified disorders of veins: Secondary | ICD-10-CM | POA: Diagnosis not present

## 2021-03-24 LAB — POCT I-STAT CREATININE: Creatinine, Ser: 0.8 mg/dL (ref 0.44–1.00)

## 2021-03-24 MED ORDER — IOHEXOL 300 MG/ML  SOLN
125.0000 mL | Freq: Once | INTRAMUSCULAR | Status: AC | PRN
  Administered 2021-03-24: 125 mL via INTRAVENOUS

## 2021-03-25 DIAGNOSIS — G4700 Insomnia, unspecified: Secondary | ICD-10-CM | POA: Diagnosis not present

## 2021-03-25 DIAGNOSIS — F418 Other specified anxiety disorders: Secondary | ICD-10-CM | POA: Diagnosis not present

## 2021-03-25 DIAGNOSIS — R1902 Left upper quadrant abdominal swelling, mass and lump: Secondary | ICD-10-CM | POA: Diagnosis not present

## 2021-03-25 DIAGNOSIS — N200 Calculus of kidney: Secondary | ICD-10-CM | POA: Diagnosis not present

## 2021-03-26 ENCOUNTER — Other Ambulatory Visit: Payer: Self-pay

## 2021-03-26 DIAGNOSIS — Z03818 Encounter for observation for suspected exposure to other biological agents ruled out: Secondary | ICD-10-CM | POA: Diagnosis not present

## 2021-03-26 DIAGNOSIS — Z20822 Contact with and (suspected) exposure to covid-19: Secondary | ICD-10-CM | POA: Diagnosis not present

## 2021-03-29 ENCOUNTER — Encounter: Payer: Self-pay | Admitting: *Deleted

## 2021-04-14 ENCOUNTER — Other Ambulatory Visit: Payer: Self-pay

## 2021-05-10 ENCOUNTER — Other Ambulatory Visit: Payer: 59 | Admitting: Urology

## 2021-05-13 ENCOUNTER — Other Ambulatory Visit: Payer: Self-pay

## 2021-05-17 ENCOUNTER — Encounter: Admission: RE | Payer: Self-pay | Source: Home / Self Care

## 2021-05-17 ENCOUNTER — Ambulatory Visit: Admission: RE | Admit: 2021-05-17 | Payer: 59 | Source: Home / Self Care

## 2021-05-17 SURGERY — COLONOSCOPY WITH PROPOFOL
Anesthesia: General

## 2021-05-25 ENCOUNTER — Other Ambulatory Visit: Payer: Self-pay

## 2021-06-07 DIAGNOSIS — Z03818 Encounter for observation for suspected exposure to other biological agents ruled out: Secondary | ICD-10-CM | POA: Diagnosis not present

## 2021-06-07 DIAGNOSIS — Z20822 Contact with and (suspected) exposure to covid-19: Secondary | ICD-10-CM | POA: Diagnosis not present

## 2021-06-11 ENCOUNTER — Other Ambulatory Visit: Payer: Self-pay

## 2021-06-11 DIAGNOSIS — J0100 Acute maxillary sinusitis, unspecified: Secondary | ICD-10-CM | POA: Diagnosis not present

## 2021-06-11 MED ORDER — PREDNISONE 20 MG PO TABS
ORAL_TABLET | ORAL | 0 refills | Status: DC
  Filled 2021-06-11: qty 12, 6d supply, fill #0

## 2021-06-14 ENCOUNTER — Other Ambulatory Visit: Payer: Self-pay

## 2021-06-14 MED ORDER — AMOXICILLIN-POT CLAVULANATE 875-125 MG PO TABS
ORAL_TABLET | ORAL | 0 refills | Status: DC
  Filled 2021-06-14: qty 20, 10d supply, fill #0

## 2021-06-14 MED ORDER — FLUCONAZOLE 150 MG PO TABS
ORAL_TABLET | ORAL | 1 refills | Status: DC
  Filled 2021-06-14: qty 1, 1d supply, fill #0

## 2021-06-29 ENCOUNTER — Other Ambulatory Visit: Payer: Self-pay

## 2021-07-02 ENCOUNTER — Other Ambulatory Visit: Payer: Self-pay | Admitting: Gastroenterology

## 2021-07-02 DIAGNOSIS — Z1211 Encounter for screening for malignant neoplasm of colon: Secondary | ICD-10-CM

## 2021-07-02 DIAGNOSIS — R935 Abnormal findings on diagnostic imaging of other abdominal regions, including retroperitoneum: Secondary | ICD-10-CM

## 2021-07-26 ENCOUNTER — Other Ambulatory Visit: Payer: Self-pay

## 2021-07-27 ENCOUNTER — Other Ambulatory Visit: Payer: Self-pay

## 2021-07-28 ENCOUNTER — Other Ambulatory Visit: Payer: Self-pay

## 2021-07-29 ENCOUNTER — Other Ambulatory Visit: Payer: Self-pay

## 2021-07-30 ENCOUNTER — Other Ambulatory Visit: Payer: Self-pay

## 2021-07-30 MED ORDER — ESTRADIOL 1 MG PO TABS
ORAL_TABLET | ORAL | 3 refills | Status: DC
  Filled 2021-07-30: qty 30, 30d supply, fill #0
  Filled 2021-09-03: qty 30, 30d supply, fill #1
  Filled 2021-10-14: qty 30, 30d supply, fill #2
  Filled 2021-11-16: qty 30, 30d supply, fill #3

## 2021-08-09 ENCOUNTER — Other Ambulatory Visit: Payer: Self-pay

## 2021-08-09 DIAGNOSIS — J069 Acute upper respiratory infection, unspecified: Secondary | ICD-10-CM | POA: Diagnosis not present

## 2021-08-09 MED ORDER — AMOXICILLIN-POT CLAVULANATE 875-125 MG PO TABS
ORAL_TABLET | ORAL | 0 refills | Status: DC
  Filled 2021-08-09: qty 14, 7d supply, fill #0

## 2021-08-09 MED ORDER — GUAIFENESIN-CODEINE 100-10 MG/5ML PO SOLN
ORAL | 0 refills | Status: DC
  Filled 2021-08-09: qty 118, 6d supply, fill #0

## 2021-08-16 ENCOUNTER — Ambulatory Visit: Payer: 59

## 2021-08-18 ENCOUNTER — Ambulatory Visit: Admission: RE | Admit: 2021-08-18 | Payer: 59 | Source: Ambulatory Visit

## 2021-08-23 ENCOUNTER — Other Ambulatory Visit: Payer: Self-pay

## 2021-08-23 ENCOUNTER — Ambulatory Visit
Admission: RE | Admit: 2021-08-23 | Discharge: 2021-08-23 | Disposition: A | Discharge status: Home / Self Care | Payer: 59 | Source: Ambulatory Visit | Attending: Gastroenterology | Admitting: Gastroenterology

## 2021-08-23 DIAGNOSIS — I700 Atherosclerosis of aorta: Secondary | ICD-10-CM | POA: Diagnosis not present

## 2021-08-23 DIAGNOSIS — D1771 Benign lipomatous neoplasm of kidney: Secondary | ICD-10-CM | POA: Diagnosis not present

## 2021-08-23 DIAGNOSIS — Z1211 Encounter for screening for malignant neoplasm of colon: Secondary | ICD-10-CM | POA: Diagnosis not present

## 2021-08-23 DIAGNOSIS — N200 Calculus of kidney: Secondary | ICD-10-CM | POA: Diagnosis not present

## 2021-08-23 DIAGNOSIS — R935 Abnormal findings on diagnostic imaging of other abdominal regions, including retroperitoneum: Secondary | ICD-10-CM | POA: Insufficient documentation

## 2021-08-23 DIAGNOSIS — N289 Disorder of kidney and ureter, unspecified: Secondary | ICD-10-CM | POA: Diagnosis not present

## 2021-08-23 MED ORDER — IOHEXOL 300 MG/ML  SOLN
100.0000 mL | Freq: Once | INTRAMUSCULAR | Status: AC | PRN
  Administered 2021-08-23: 100 mL via INTRAVENOUS

## 2021-08-25 ENCOUNTER — Telehealth: Payer: Self-pay | Admitting: Urology

## 2021-08-25 NOTE — Telephone Encounter (Signed)
Pt had CT scan ordered by pcp of Abdomen/Pelvis and wants Dr Bernardo Heater to take a look at it and let her know if she needs to follow up here.  She doesn't have an upcoming appt.

## 2021-08-26 ENCOUNTER — Ambulatory Visit: Payer: 59

## 2021-09-03 ENCOUNTER — Other Ambulatory Visit: Payer: Self-pay

## 2021-09-10 ENCOUNTER — Other Ambulatory Visit: Payer: Self-pay

## 2021-10-14 ENCOUNTER — Other Ambulatory Visit: Payer: Self-pay

## 2021-10-14 MED ORDER — SERTRALINE HCL 50 MG PO TABS
ORAL_TABLET | ORAL | 3 refills | Status: DC
  Filled 2021-10-14: qty 45, 30d supply, fill #0
  Filled 2021-11-16: qty 45, 30d supply, fill #1
  Filled 2021-12-21: qty 45, 30d supply, fill #2

## 2021-10-14 MED ORDER — FLUTICASONE PROPIONATE 50 MCG/ACT NA SUSP
NASAL | 11 refills | Status: AC
  Filled 2021-10-14: qty 16, 30d supply, fill #0
  Filled 2022-03-16: qty 16, 30d supply, fill #1
  Filled 2022-05-17: qty 16, 30d supply, fill #2

## 2021-11-16 ENCOUNTER — Other Ambulatory Visit: Payer: Self-pay

## 2021-11-23 ENCOUNTER — Other Ambulatory Visit: Payer: Self-pay | Admitting: Infectious Diseases

## 2021-11-23 ENCOUNTER — Other Ambulatory Visit: Payer: Self-pay

## 2021-11-23 DIAGNOSIS — N952 Postmenopausal atrophic vaginitis: Secondary | ICD-10-CM | POA: Diagnosis not present

## 2021-11-23 DIAGNOSIS — Z1231 Encounter for screening mammogram for malignant neoplasm of breast: Secondary | ICD-10-CM | POA: Diagnosis not present

## 2021-11-23 DIAGNOSIS — Z0000 Encounter for general adult medical examination without abnormal findings: Secondary | ICD-10-CM | POA: Diagnosis not present

## 2021-11-23 DIAGNOSIS — I951 Orthostatic hypotension: Secondary | ICD-10-CM | POA: Diagnosis not present

## 2021-11-23 MED ORDER — SERTRALINE HCL 50 MG PO TABS
ORAL_TABLET | ORAL | 3 refills | Status: DC
  Filled 2021-11-23: qty 180, 90d supply, fill #0

## 2021-11-23 MED ORDER — MIDODRINE HCL 5 MG PO TABS
5.0000 mg | ORAL_TABLET | Freq: Two times a day (BID) | ORAL | 3 refills | Status: AC
  Filled 2021-11-23: qty 180, 90d supply, fill #0
  Filled 2022-03-16: qty 60, 30d supply, fill #0
  Filled 2022-03-16: qty 120, 60d supply, fill #0

## 2021-11-23 MED ORDER — ESTRADIOL 1 MG PO TABS
ORAL_TABLET | ORAL | 3 refills | Status: AC
  Filled 2021-11-23 – 2021-12-21 (×2): qty 90, 90d supply, fill #0
  Filled 2022-05-17: qty 90, 90d supply, fill #1

## 2021-11-24 ENCOUNTER — Other Ambulatory Visit: Payer: Self-pay

## 2021-11-30 ENCOUNTER — Ambulatory Visit: Payer: 59

## 2021-12-07 ENCOUNTER — Other Ambulatory Visit: Payer: Self-pay

## 2021-12-09 ENCOUNTER — Other Ambulatory Visit: Payer: Self-pay

## 2021-12-09 ENCOUNTER — Ambulatory Visit: Payer: 59 | Attending: Infectious Diseases

## 2021-12-09 DIAGNOSIS — M79661 Pain in right lower leg: Secondary | ICD-10-CM

## 2021-12-09 NOTE — Therapy (Signed)
[facility] ?[place], [place], [address] ?Phone: [phone]   Fax:  [phone] ? ?Patient Details  ?Name: Caitlin Yu ?MRN: [id number] ?Date of Birth: [birth year] ?Referring Provider:  Leonel Ramsay, MD ? ?Encounter Date: 12/09/2021 ? ?PT/OT/SLP Screening Form ? ? ?Time: in 46: 00 ? ?Time out: 11 : 19  ? ?Complaint R gastroc and posterior knee pain ?Past Medical Hx:  partial achilles tear ?Injury Date:~ 2 weeks ago  ?Pain Scale: worst 7/10; current 3/10  ?Patient?s phone number:  ? ?Hx (this occurrence):  ?Patient reports pain began ~ 2 weeks ago in her gastroc region and posterior knee. Pain was gradual increase with on apparent onset/injury. Pain worsens with running causing patient to limit her running.  ? ?Assessment: ? Right Left  ?Knee Flexion 5 5  ?DF 4+ 5  ?PF 4 *pain 5  ? ?Knee ruleout: ?ACL, PCL, MCL, and LCL ligaments intact ?Meniscus tests negative ? ?Achilles tests: negative ? ?Palpation; tender to palpation with significant trigger points.  ? ? ? ?Recommendations:   ? ?Comments:Patient presents with signs and symptoms indicating strain of gastroc musculature with concurrent muscle tension limitations and trigger points. TDN performed with patient verbalizing understanding and responding well to treatment. HEP given for muscle tissue lengthening and ankle strengthening. ? ?Trigger Point Dry Needling (TDN), unbilled ?Education performed with patient regarding potential benefit of TDN. Reviewed precautions and risks with patient. Reviewed special precautions/risks over lung fields which include pneumothorax. Reviewed signs and symptoms of pneumothorax and advised pt to go to ER immediately if these symptoms develop advise them of dry needling treatment. Extensive time spent with pt to ensure full understanding of TDN risks. Pt provided verbal consent to treatment. TDN performed to  with 0.3 x 30 single needle placements with  local twitch response (LTR). Pistoning technique utilized. Improved pain-free motion following intervention. R Gastroc x3 minutes  ? ? ? ?Access Code: [email or web address] ?URL: [email or web address] ?Date: 12/09/2021 ?Prepared by: Janna Arch ? ?Exercises ?- Standing Gastroc Stretch  - 1 x daily - 7 x weekly - 2 sets - 2 reps - 30 hold ?- Gastroc Stretch with Foot at [place]  - 1 x daily - 7 x weekly - 2 sets - 2 reps - 30 hold ?- Seated Ankle Alphabet  - 1 x daily - 7 x weekly - 2 sets - 10 reps - 5 hold ? ? ?'[]'$  Patient would benefit from an MD referral ?'[]'$  Patient would benefit from a full PT/OT/ SLP evaluation and treatment. ?'[x]'$  No intervention recommended at this time. ? ? ?Janna Arch, PT, DPT ? ?12/09/2021, 11:32 AM ? ?[facility] ?[place], [place], [address] ?Phone: [phone]   Fax:  [phone] ?

## 2021-12-14 ENCOUNTER — Other Ambulatory Visit: Payer: Self-pay

## 2021-12-21 ENCOUNTER — Other Ambulatory Visit: Payer: Self-pay

## 2021-12-21 MED ORDER — ALBUTEROL SULFATE HFA 108 (90 BASE) MCG/ACT IN AERS
2.0000 | INHALATION_SPRAY | Freq: Four times a day (QID) | RESPIRATORY_TRACT | 0 refills | Status: DC | PRN
Start: 2021-12-21 — End: 2022-05-19
  Filled 2021-12-21: qty 18, 25d supply, fill #0

## 2021-12-29 DIAGNOSIS — M9903 Segmental and somatic dysfunction of lumbar region: Secondary | ICD-10-CM | POA: Diagnosis not present

## 2021-12-29 DIAGNOSIS — M62830 Muscle spasm of back: Secondary | ICD-10-CM | POA: Diagnosis not present

## 2021-12-29 DIAGNOSIS — M9905 Segmental and somatic dysfunction of pelvic region: Secondary | ICD-10-CM | POA: Diagnosis not present

## 2021-12-29 DIAGNOSIS — M5416 Radiculopathy, lumbar region: Secondary | ICD-10-CM | POA: Diagnosis not present

## 2021-12-30 DIAGNOSIS — M62830 Muscle spasm of back: Secondary | ICD-10-CM | POA: Diagnosis not present

## 2021-12-30 DIAGNOSIS — M5416 Radiculopathy, lumbar region: Secondary | ICD-10-CM | POA: Diagnosis not present

## 2021-12-30 DIAGNOSIS — M9905 Segmental and somatic dysfunction of pelvic region: Secondary | ICD-10-CM | POA: Diagnosis not present

## 2021-12-30 DIAGNOSIS — M9903 Segmental and somatic dysfunction of lumbar region: Secondary | ICD-10-CM | POA: Diagnosis not present

## 2022-01-03 DIAGNOSIS — M9905 Segmental and somatic dysfunction of pelvic region: Secondary | ICD-10-CM | POA: Diagnosis not present

## 2022-01-03 DIAGNOSIS — M9903 Segmental and somatic dysfunction of lumbar region: Secondary | ICD-10-CM | POA: Diagnosis not present

## 2022-01-03 DIAGNOSIS — M5416 Radiculopathy, lumbar region: Secondary | ICD-10-CM | POA: Diagnosis not present

## 2022-01-03 DIAGNOSIS — M62830 Muscle spasm of back: Secondary | ICD-10-CM | POA: Diagnosis not present

## 2022-01-05 DIAGNOSIS — M62830 Muscle spasm of back: Secondary | ICD-10-CM | POA: Diagnosis not present

## 2022-01-05 DIAGNOSIS — M5416 Radiculopathy, lumbar region: Secondary | ICD-10-CM | POA: Diagnosis not present

## 2022-01-05 DIAGNOSIS — M9903 Segmental and somatic dysfunction of lumbar region: Secondary | ICD-10-CM | POA: Diagnosis not present

## 2022-01-05 DIAGNOSIS — M9905 Segmental and somatic dysfunction of pelvic region: Secondary | ICD-10-CM | POA: Diagnosis not present

## 2022-01-07 DIAGNOSIS — M5416 Radiculopathy, lumbar region: Secondary | ICD-10-CM | POA: Diagnosis not present

## 2022-01-07 DIAGNOSIS — M9905 Segmental and somatic dysfunction of pelvic region: Secondary | ICD-10-CM | POA: Diagnosis not present

## 2022-01-07 DIAGNOSIS — M62830 Muscle spasm of back: Secondary | ICD-10-CM | POA: Diagnosis not present

## 2022-01-07 DIAGNOSIS — M9903 Segmental and somatic dysfunction of lumbar region: Secondary | ICD-10-CM | POA: Diagnosis not present

## 2022-01-11 DIAGNOSIS — M9903 Segmental and somatic dysfunction of lumbar region: Secondary | ICD-10-CM | POA: Diagnosis not present

## 2022-01-11 DIAGNOSIS — M5416 Radiculopathy, lumbar region: Secondary | ICD-10-CM | POA: Diagnosis not present

## 2022-01-11 DIAGNOSIS — M62830 Muscle spasm of back: Secondary | ICD-10-CM | POA: Diagnosis not present

## 2022-01-11 DIAGNOSIS — M9905 Segmental and somatic dysfunction of pelvic region: Secondary | ICD-10-CM | POA: Diagnosis not present

## 2022-01-13 ENCOUNTER — Other Ambulatory Visit: Payer: Self-pay

## 2022-01-13 ENCOUNTER — Emergency Department
Admission: EM | Admit: 2022-01-13 | Discharge: 2022-01-13 | Disposition: A | Discharge status: Home / Self Care | Payer: 59 | Attending: Emergency Medicine | Admitting: Emergency Medicine

## 2022-01-13 ENCOUNTER — Encounter: Payer: Self-pay | Admitting: Emergency Medicine

## 2022-01-13 ENCOUNTER — Emergency Department: Payer: 59

## 2022-01-13 DIAGNOSIS — G43109 Migraine with aura, not intractable, without status migrainosus: Secondary | ICD-10-CM

## 2022-01-13 DIAGNOSIS — R42 Dizziness and giddiness: Secondary | ICD-10-CM | POA: Diagnosis not present

## 2022-01-13 DIAGNOSIS — R519 Headache, unspecified: Secondary | ICD-10-CM | POA: Diagnosis present

## 2022-01-13 DIAGNOSIS — R001 Bradycardia, unspecified: Secondary | ICD-10-CM | POA: Diagnosis not present

## 2022-01-13 DIAGNOSIS — G43909 Migraine, unspecified, not intractable, without status migrainosus: Secondary | ICD-10-CM | POA: Diagnosis not present

## 2022-01-13 LAB — CBC WITH DIFFERENTIAL/PLATELET
Abs Immature Granulocytes: 0.01 10*3/uL (ref 0.00–0.07)
Basophils Absolute: 0 10*3/uL (ref 0.0–0.1)
Basophils Relative: 1 %
Eosinophils Absolute: 0.1 10*3/uL (ref 0.0–0.5)
Eosinophils Relative: 2 %
HCT: 41.9 % (ref 36.0–46.0)
Hemoglobin: 14.1 g/dL (ref 12.0–15.0)
Immature Granulocytes: 0 %
Lymphocytes Relative: 34 %
Lymphs Abs: 1.4 10*3/uL (ref 0.7–4.0)
MCH: 30.7 pg (ref 26.0–34.0)
MCHC: 33.7 g/dL (ref 30.0–36.0)
MCV: 91.3 fL (ref 80.0–100.0)
Monocytes Absolute: 0.3 10*3/uL (ref 0.1–1.0)
Monocytes Relative: 7 %
Neutro Abs: 2.3 10*3/uL (ref 1.7–7.7)
Neutrophils Relative %: 56 %
Platelets: 241 10*3/uL (ref 150–400)
RBC: 4.59 MIL/uL (ref 3.87–5.11)
RDW: 12.2 % (ref 11.5–15.5)
WBC: 4.1 10*3/uL (ref 4.0–10.5)
nRBC: 0 % (ref 0.0–0.2)

## 2022-01-13 LAB — COMPREHENSIVE METABOLIC PANEL
ALT: 16 U/L (ref 0–44)
AST: 24 U/L (ref 15–41)
Albumin: 4.7 g/dL (ref 3.5–5.0)
Alkaline Phosphatase: 58 U/L (ref 38–126)
Anion gap: 8 (ref 5–15)
BUN: 20 mg/dL (ref 6–20)
CO2: 23 mmol/L (ref 22–32)
Calcium: 10.1 mg/dL (ref 8.9–10.3)
Chloride: 107 mmol/L (ref 98–111)
Creatinine, Ser: 0.85 mg/dL (ref 0.44–1.00)
GFR, Estimated: >60 mL/min (ref >60)
Glucose, Bld: 80 mg/dL (ref 70–99)
Potassium: 3.9 mmol/L (ref 3.5–5.1)
Sodium: 138 mmol/L (ref 135–145)
Total Bilirubin: 0.9 mg/dL (ref 0.3–1.2)
Total Protein: 7.5 g/dL (ref 6.5–8.1)

## 2022-01-13 LAB — MAGNESIUM: Magnesium: 2.4 mg/dL (ref 1.7–2.4)

## 2022-01-13 MED ORDER — PROCHLORPERAZINE MALEATE 10 MG PO TABS
10.0000 mg | ORAL_TABLET | Freq: Four times a day (QID) | ORAL | 0 refills | Status: AC | PRN
  Filled 2022-01-13: qty 20, 5d supply, fill #0

## 2022-01-13 MED ORDER — DIPHENHYDRAMINE HCL 50 MG/ML IJ SOLN
25.0000 mg | Freq: Once | INTRAMUSCULAR | Status: AC
  Administered 2022-01-13: 25 mg via INTRAVENOUS
  Filled 2022-01-13: qty 1

## 2022-01-13 MED ORDER — LACTATED RINGERS IV BOLUS
1000.0000 mL | Freq: Once | INTRAVENOUS | Status: AC
  Administered 2022-01-13: 1000 mL via INTRAVENOUS

## 2022-01-13 MED ORDER — IOHEXOL 350 MG/ML SOLN
75.0000 mL | Freq: Once | INTRAVENOUS | Status: AC | PRN
  Administered 2022-01-13: 75 mL via INTRAVENOUS

## 2022-01-13 MED ORDER — PROCHLORPERAZINE EDISYLATE 10 MG/2ML IJ SOLN
10.0000 mg | Freq: Once | INTRAMUSCULAR | Status: AC
  Administered 2022-01-13: 10 mg via INTRAVENOUS
  Filled 2022-01-13: qty 2

## 2022-01-13 NOTE — ED Notes (Signed)
Patient reports that this morning she noticed that when she turned her head to the right that she felt a "head rush" sensation. Patient reports that when she was putting on her makeup she noticed that she had increased sensitivity in the right side of her face and also in her right arm. Pt states that she has normal sensation in her right leg. Pt does not have slurred speech or facial droop at this time. 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   ?

## 2022-01-13 NOTE — ED Notes (Addendum)
Immediately after pt was given IV benadryl pt started c/o burning in her throat. This RN called pharmacy to see if this was a possible reaction. Per pharmacy both medications can cause drying of the mucus membranes, including the throat. Per pharmacy, as long as pt is not having any throat swelling or shortness of breath then no cause for concern.  ? ?Pt denies shortness of breath or feeling like her throat is swelling. Burning in throat resolved before this RN left the room.  ?

## 2022-01-13 NOTE — ED Provider Notes (Signed)
? ?[facility] ?Provider Note ? ? ? Event Date/Time  ? First MD Initiated Contact with Patient 01/13/22 [id number]   ?  (approximate) ? ? ?History  ? ?Chief Complaint ?Dizziness ? ? ?HPI ? ?Caitlin Yu is a 51 y.o. female with past medical history of migraines and anxiety who presents to the ED complaining of dizziness.  Patient reports that for the past 2 days she has had abnormal sensation over the right side of her face which she describes as a heaviness with mild discomfort.  She denies numbness or tingling, but instead states that the right side of her face feels very sensitive to touch.  This has been associated with dizziness and nausea whenever she looks to the right side.  Symptoms have been mild and manageable for the past 2 days, but were more severe when she woke up this morning.  She has not noticed any drooping in her face and denies any vision or speech changes.  She does state that the sensitivity to touch extended into her right arm earlier this morning and she felt very unsteady on her feet.  She describes similar symptoms in the past with diagnosis of vestibular migraines, but has never had symptoms as severe as today and has not seen a neurologist since relocating to the area from [place].  She does state that she had her neck adjusted by her chiropractor a couple of days ago, just prior to onset of symptoms. ?  ? ? ?Physical Exam  ? ?Triage Vital Signs: ?ED Triage Vitals  ?Enc Vitals Group  ?   BP 01/13/22 0913 120/80  ?   Pulse Rate 01/13/22 0913 90  ?   Resp 01/13/22 0913 18  ?   Temp 01/13/22 0913 (!) 97.4 ?F (36.3 ?C)  ?   Temp Source 01/13/22 0913 Oral  ?   SpO2 01/13/22 0913 99 %  ?   Weight 01/13/22 0900 141 lb 15.6 oz (64.4 kg)  ?   Height 01/13/22 0900 '5\' 7"'$  (1.702 m)  ?   Head Circumference --   ?   Peak Flow --   ?   Pain Score 01/13/22 0900 0  ?   Pain Loc --   ?   Pain Edu? --   ?   Excl. in [place]? --   ? ? ?Most recent vital signs: ?Vitals:  ? 01/13/22 1015 01/13/22  1030  ?BP: 113/81 115/85  ?Pulse: (!) 50 (!) 53  ?Resp: 17 (!) 23  ?Temp:    ?SpO2: 100% 99%  ? ? ?Constitutional: Alert and oriented. ?Eyes: Conjunctivae are normal.  Pupils equal, round, and reactive to light bilaterally.  Extraocular movements intact but patient becomes extremely dizzy and nauseous with movement of eyes to the right. ?Head: Atraumatic. ?Nose: No congestion/rhinnorhea. ?Mouth/Throat: Mucous membranes are moist.  ?Neck: No tenderness noted. ?Cardiovascular: Normal rate, regular rhythm. Grossly normal heart sounds.  2+ radial pulses bilaterally. ?Respiratory: Normal respiratory effort.  No retractions. Lungs CTAB. ?Gastrointestinal: Soft and nontender. No distention. ?Musculoskeletal: No lower extremity tenderness nor edema.  ?Neurologic:  Normal speech and language.  Mild right facial droop noted with unequal sensation over right face compared to left.  5 out of 5 strength in bilateral upper and lower extremities with no pronator drift. ? ? ? ?ED Results / Procedures / Treatments  ? ?Labs ?(all labs ordered are listed, but only abnormal results are displayed) ?Labs Reviewed  ?CBC WITH DIFFERENTIAL/PLATELET  ?COMPREHENSIVE METABOLIC PANEL  ?MAGNESIUM  ? ? ? ?  EKG ? ?ED ECG REPORT ?Tempie Hoist, the attending physician, personally viewed and interpreted this ECG. ? ? Date: 01/13/2022 ? EKG Time: 9:56 ? Rate: 55 ? Rhythm: sinus bradycardia ? Axis: Normal ? Intervals:none ? ST&T Change: None ? ?RADIOLOGY ?CTA of head and neck reviewed by me with no hemorrhage or midline shift, no obvious dissection noted. ? ?PROCEDURES: ? ?Critical Care performed: No ? ?Procedures ? ? ?MEDICATIONS ORDERED IN ED: ?Medications  ?prochlorperazine (COMPAZINE) injection 10 mg (10 mg Intravenous Given 01/13/22 1005)  ?diphenhydrAMINE (BENADRYL) injection 25 mg (25 mg Intravenous Given 01/13/22 1009)  ?lactated ringers bolus 1,000 mL (0 mLs Intravenous Stopped 01/13/22 1200)  ?iohexol (OMNIPAQUE) 350 MG/ML injection 75 mL  (75 mLs Intravenous Contrast Given 01/13/22 1053)  ? ? ? ?IMPRESSION / MDM / ASSESSMENT AND PLAN / ED COURSE  ?I reviewed the triage vital signs and the nursing notes. ?             ?               ? ?51 y.o. female with past medical history of migraines and anxiety who presents to the ED complaining of abnormal sensation over the right face with dizziness and nausea whenever looking to the right for the past 2 days, more severe since waking up this morning. ? ?Differential diagnosis includes, but is not limited to, complicated migraine, vestibular migraine, stroke, carotid artery dissection, vertebral artery dissection, electrolyte abnormality. ? ?Patient nontoxic-appearing and in no acute distress, vital signs are unremarkable, however she has subtle right-sided facial droop with abnormal sensation of the right side of her face compared to the left.  With recent chiropractic adjustment, symptoms are concerning for vertebral artery dissection or carotid artery dissection and we will further assess with CT angiogram of her head and neck.  Lower suspicion of stroke and she is outside the window for acute intervention given 2 days of symptoms with acute worsening overnight last night.  We will screen labs for electrolyte abnormality, plan to treat symptomatically with migraine cocktail if angiogram is unremarkable. ? ?CTA of head and neck is negative for acute process, no evidence of SAH or dissection.  On reassessment following migraine cocktail, patient's symptoms appear to be resolving.  Her smile is now equal with normal sensation over the right face, she is able to look to the right without ongoing dizziness.  Very low suspicion for stroke at this time and symptoms seem consistent with complicated migraine, which she has a history of.  She is appropriate for discharge home and was provided with referral to establish care with neurology locally.  She was counseled to return to the ED for new or worsening symptoms,  patient agrees with plan. ? ?  ? ? ?FINAL CLINICAL IMPRESSION(S) / ED DIAGNOSES  ? ?Final diagnoses:  ?Complicated migraine  ?Vertigo  ? ? ? ?Rx / DC Orders  ? ?ED Discharge Orders   ? ?      Ordered  ?  prochlorperazine (COMPAZINE) 10 MG tablet  Every 6 hours PRN       ? 01/13/22 1225  ? ?  ?  ? ?  ? ? ? ?Note:  This document was prepared using Dragon voice recognition software and may include unintentional dictation errors. ?  ?Blake Divine, MD ?01/13/22 1227 ? ?

## 2022-01-13 NOTE — ED Triage Notes (Signed)
Pt comes into the ED via POV c/o dizziness and sensation problems on the right side of her face.  PT denies that it hurts, denies weakness, but states it feels "weird".  Pt has been diagnosed with vestibular headaches. Pt states the symptoms started this morning.   ?

## 2022-01-13 NOTE — ED Notes (Signed)
Patient given warm blanket. Pt is resting and is in NAD.  ?

## 2022-01-17 ENCOUNTER — Other Ambulatory Visit: Payer: Self-pay

## 2022-01-17 DIAGNOSIS — R202 Paresthesia of skin: Secondary | ICD-10-CM | POA: Diagnosis not present

## 2022-01-17 DIAGNOSIS — R42 Dizziness and giddiness: Secondary | ICD-10-CM | POA: Diagnosis not present

## 2022-01-17 MED ORDER — MECLIZINE HCL 25 MG PO TABS
ORAL_TABLET | ORAL | 0 refills | Status: AC
  Filled 2022-01-17: qty 30, 10d supply, fill #0

## 2022-01-19 ENCOUNTER — Other Ambulatory Visit
Admission: RE | Admit: 2022-01-19 | Discharge: 2022-01-19 | Disposition: A | Discharge status: Home / Self Care | Payer: 59 | Attending: Infectious Diseases | Admitting: Infectious Diseases

## 2022-01-19 DIAGNOSIS — R42 Dizziness and giddiness: Secondary | ICD-10-CM | POA: Diagnosis not present

## 2022-01-19 DIAGNOSIS — R202 Paresthesia of skin: Secondary | ICD-10-CM | POA: Diagnosis not present

## 2022-01-19 LAB — VITAMIN B12: Vitamin B-12: 257 pg/mL (ref 180–914)

## 2022-01-19 LAB — TSH: TSH: 5.812 u[IU]/mL — ABNORMAL HIGH (ref 0.350–4.500)

## 2022-01-25 DIAGNOSIS — H532 Diplopia: Secondary | ICD-10-CM | POA: Diagnosis not present

## 2022-01-25 DIAGNOSIS — R202 Paresthesia of skin: Secondary | ICD-10-CM | POA: Diagnosis not present

## 2022-01-25 DIAGNOSIS — E559 Vitamin D deficiency, unspecified: Secondary | ICD-10-CM | POA: Diagnosis not present

## 2022-01-25 DIAGNOSIS — G43809 Other migraine, not intractable, without status migrainosus: Secondary | ICD-10-CM | POA: Diagnosis not present

## 2022-01-27 ENCOUNTER — Other Ambulatory Visit: Payer: Self-pay | Admitting: Neurology

## 2022-01-27 DIAGNOSIS — R202 Paresthesia of skin: Secondary | ICD-10-CM

## 2022-02-02 ENCOUNTER — Other Ambulatory Visit: Payer: Self-pay

## 2022-02-02 ENCOUNTER — Emergency Department
Admission: EM | Admit: 2022-02-02 | Discharge: 2022-02-02 | Disposition: A | Discharge status: Home / Self Care | Payer: 59 | Attending: Emergency Medicine | Admitting: Emergency Medicine

## 2022-02-02 ENCOUNTER — Emergency Department: Payer: 59

## 2022-02-02 DIAGNOSIS — G43819 Other migraine, intractable, without status migrainosus: Secondary | ICD-10-CM | POA: Diagnosis not present

## 2022-02-02 DIAGNOSIS — H52223 Regular astigmatism, bilateral: Secondary | ICD-10-CM | POA: Diagnosis not present

## 2022-02-02 DIAGNOSIS — R9431 Abnormal electrocardiogram [ECG] [EKG]: Secondary | ICD-10-CM | POA: Diagnosis not present

## 2022-02-02 DIAGNOSIS — R200 Anesthesia of skin: Secondary | ICD-10-CM | POA: Diagnosis not present

## 2022-02-02 DIAGNOSIS — R202 Paresthesia of skin: Secondary | ICD-10-CM

## 2022-02-02 DIAGNOSIS — R29818 Other symptoms and signs involving the nervous system: Secondary | ICD-10-CM | POA: Diagnosis not present

## 2022-02-02 DIAGNOSIS — H4922 Sixth [abducent] nerve palsy, left eye: Secondary | ICD-10-CM | POA: Diagnosis not present

## 2022-02-02 DIAGNOSIS — G43809 Other migraine, not intractable, without status migrainosus: Secondary | ICD-10-CM | POA: Diagnosis not present

## 2022-02-02 DIAGNOSIS — H524 Presbyopia: Secondary | ICD-10-CM | POA: Diagnosis not present

## 2022-02-02 DIAGNOSIS — H5213 Myopia, bilateral: Secondary | ICD-10-CM | POA: Diagnosis not present

## 2022-02-02 LAB — DIFFERENTIAL
Abs Immature Granulocytes: 0.01 10*3/uL (ref 0.00–0.07)
Basophils Absolute: 0 10*3/uL (ref 0.0–0.1)
Basophils Relative: 1 %
Eosinophils Absolute: 0.1 10*3/uL (ref 0.0–0.5)
Eosinophils Relative: 1 %
Immature Granulocytes: 0 %
Lymphocytes Relative: 42 %
Lymphs Abs: 2 10*3/uL (ref 0.7–4.0)
Monocytes Absolute: 0.3 10*3/uL (ref 0.1–1.0)
Monocytes Relative: 6 %
Neutro Abs: 2.4 10*3/uL (ref 1.7–7.7)
Neutrophils Relative %: 50 %

## 2022-02-02 LAB — COMPREHENSIVE METABOLIC PANEL
ALT: 17 U/L (ref 0–44)
AST: 27 U/L (ref 15–41)
Albumin: 4.5 g/dL (ref 3.5–5.0)
Alkaline Phosphatase: 55 U/L (ref 38–126)
Anion gap: 10 (ref 5–15)
BUN: 14 mg/dL (ref 6–20)
CO2: 21 mmol/L — ABNORMAL LOW (ref 22–32)
Calcium: 10.5 mg/dL — ABNORMAL HIGH (ref 8.9–10.3)
Chloride: 106 mmol/L (ref 98–111)
Creatinine, Ser: 0.89 mg/dL (ref 0.44–1.00)
GFR, Estimated: >60 mL/min (ref >60)
Glucose, Bld: 81 mg/dL (ref 70–99)
Potassium: 4.2 mmol/L (ref 3.5–5.1)
Sodium: 137 mmol/L (ref 135–145)
Total Bilirubin: 0.6 mg/dL (ref 0.3–1.2)
Total Protein: 7.2 g/dL (ref 6.5–8.1)

## 2022-02-02 LAB — PROTIME-INR
INR: 0.9 (ref 0.8–1.2)
Prothrombin Time: 12.2 seconds (ref 11.4–15.2)

## 2022-02-02 LAB — CBC
HCT: 40.9 % (ref 36.0–46.0)
Hemoglobin: 13.9 g/dL (ref 12.0–15.0)
MCH: 31 pg (ref 26.0–34.0)
MCHC: 34 g/dL (ref 30.0–36.0)
MCV: 91.1 fL (ref 80.0–100.0)
Platelets: 262 10*3/uL (ref 150–400)
RBC: 4.49 MIL/uL (ref 3.87–5.11)
RDW: 12.4 % (ref 11.5–15.5)
WBC: 4.7 10*3/uL (ref 4.0–10.5)
nRBC: 0 % (ref 0.0–0.2)

## 2022-02-02 LAB — SEDIMENTATION RATE: Sed Rate: 13 mm/hr (ref 0–30)

## 2022-02-02 LAB — APTT: aPTT: 28 seconds (ref 24–36)

## 2022-02-02 MED ORDER — DIPHENHYDRAMINE HCL 50 MG/ML IJ SOLN
12.5000 mg | Freq: Once | INTRAMUSCULAR | Status: AC
  Administered 2022-02-02: 12.5 mg via INTRAVENOUS

## 2022-02-02 MED ORDER — DIPHENHYDRAMINE HCL 50 MG/ML IJ SOLN
12.5000 mg | Freq: Once | INTRAMUSCULAR | Status: AC
  Administered 2022-02-02: 12.5 mg via INTRAVENOUS
  Filled 2022-02-02: qty 1

## 2022-02-02 MED ORDER — GADOBUTROL 1 MMOL/ML IV SOLN
7.0000 mL | Freq: Once | INTRAVENOUS | Status: AC | PRN
  Administered 2022-02-02: 7 mL via INTRAVENOUS

## 2022-02-02 MED ORDER — LORAZEPAM 2 MG/ML IJ SOLN
0.5000 mg | Freq: Once | INTRAMUSCULAR | Status: AC
Start: 2022-02-02 — End: 2022-02-02
  Administered 2022-02-02: 0.5 mg via INTRAVENOUS
  Filled 2022-02-02: qty 1

## 2022-02-02 MED ORDER — DIPHENHYDRAMINE HCL 50 MG/ML IJ SOLN
INTRAMUSCULAR | Status: DC
Start: 2022-02-02 — End: 2022-02-02
  Filled 2022-02-02: qty 1

## 2022-02-02 MED ORDER — AMOXICILLIN-POT CLAVULANATE 875-125 MG PO TABS
1.0000 | ORAL_TABLET | Freq: Two times a day (BID) | ORAL | 0 refills | Status: AC
Start: 2022-02-02 — End: 2022-02-13
  Filled 2022-02-02: qty 20, 10d supply, fill #0

## 2022-02-02 MED ORDER — PROCHLORPERAZINE EDISYLATE 10 MG/2ML IJ SOLN
10.0000 mg | Freq: Once | INTRAMUSCULAR | Status: AC
  Administered 2022-02-02: 10 mg via INTRAVENOUS
  Filled 2022-02-02: qty 2

## 2022-02-02 MED ORDER — SODIUM CHLORIDE 0.9 % IV BOLUS
1000.0000 mL | Freq: Once | INTRAVENOUS | Status: AC
  Administered 2022-02-02: 1000 mL via INTRAVENOUS

## 2022-02-02 NOTE — ED Provider Notes (Addendum)
? ?[facility] ?Provider Note ? ? ? Event Date/Time  ? First MD Initiated Contact with Patient 02/02/22 1129   ?  (approximate) ? ? ?History  ? ?Numbness ? ? ?HPI ? ?Caitlin Yu is a 51 y.o. female with history of complex migraines who comes in with concerns for right-sided numbness and tingling.  Patient reports that she woke up at 530 felt her normal self and then around 630 she developed symptoms.  She reports she feels a right-sided tingling sensation and she felt like she had a little bit of right-sided facial droop.  She reports some double vision, blurred vision as well.  She states that she went to her eye doctor for routine follow-up given she has had these issues intermittently and they did a eye scan that was concerning for visual defects in the left eye.  She took the copy of the results over to the neurologist who told her to come here.  She states that she has the symptoms intermittently so she did not feel like it was important for her to come to the ER and that is being worked up outpatient by neurology but given they told her to come here is why she came.  She never saw the neurologist. ? ? ?Patient was seen on 4/27 had CTA that was negative.  And given her symptoms are resolving patient was discharged home with neurology follow-up ? ?I reviewed the records from neurology Dr. Manuella Ghazi where patient had MRI face with trigeminal nerve protocol ordered as well as work-up for myasthenia gravis ? ? ?Physical Exam  ? ?Triage Vital Signs: ?ED Triage Vitals  ?Enc Vitals Group  ?   BP 02/02/22 1010 125/90  ?   Pulse Rate 02/02/22 1010 72  ?   Resp 02/02/22 1010 18  ?   Temp 02/02/22 1010 (!) 97.5 ?F (36.4 ?C)  ?   Temp Source 02/02/22 1010 Oral  ?   SpO2 02/02/22 1010 100 %  ?   Weight 02/02/22 1010 160 lb (72.6 kg)  ?   Height 02/02/22 1134 '5\' 7"'$  (1.702 m)  ?   Head Circumference --   ?   Peak Flow --   ?   Pain Score 02/02/22 1010 4  ?   Pain Loc --   ?   Pain Edu? --   ?   Excl. in [place]?  --   ? ? ?Most recent vital signs: ?Vitals:  ? 02/02/22 1010  ?BP: 125/90  ?Pulse: 72  ?Resp: 18  ?Temp: (!) 97.5 ?F (36.4 ?C)  ?SpO2: 100%  ? ? ? ?General: Awake, no distress.  ?CV:  Good peripheral perfusion.  ?Resp:  Normal effort.  ?Abd:  No distention.  ?Other:  Patient has some decrease sensation on the right side.  She is got equal strength in arms and legs.  No obvious facial droop.  She reports some straining of the left eye but extraocular movements are intact and pupil is reactive. ? ? ?ED Results / Procedures / Treatments  ? ?Labs ?(all labs ordered are listed, but only abnormal results are displayed) ?Labs Reviewed  ?COMPREHENSIVE METABOLIC PANEL - Abnormal; Notable for the following components:  ?    Result Value  ? CO2 21 (*)   ? Calcium 10.5 (*)   ? All other components within normal limits  ?PROTIME-INR  ?APTT  ?CBC  ?DIFFERENTIAL  ?CBG MONITORING, ED  ?POC URINE PREG, ED  ? ? ? ?EKG ? ?My interpretation  of EKG: ? ?Normal sinus rate of 62 without any ST elevation or T wave inversions, normal intervals ? ?RADIOLOGY ? ?CT head personally reviewed and interpreted by myself without any evidence of intercranial hemorrhage. ? ? ?PROCEDURES: ? ?Critical Care performed: No ? ?Procedures ? ? ?MEDICATIONS ORDERED IN ED: ?Medications - No data to display ? ? ?IMPRESSION / MDM / ASSESSMENT AND PLAN / ED COURSE  ?I reviewed the triage vital signs and the nursing notes. ? ?Patient comes in with history of complex migraines with what could be concerning for recurrent complex migraine but sent over due to concerns for some eye testing at the eye clinic.  I attempted to call the number but they are currently not available.  Patient does report being on estrogen.  This concerning for acute life-threatening pathology such as stroke, MS flare, venous thrombus.  She has had prior MRI that was concerning for MS but reports that this was worked up and she was told she did not have it therefore we will do MRI with and  without. ? ?CBC normal CMP slightly elevated calcium.  Sed rate normal ? ?1:33 PM patient reports feeling very jittery and anxious.  Not sure if it could relate to the Compazine.  She had previously when she was here we will give another 12.5 of IV Benadryl. ? ?I was able to get a hold of the patient's eye doctor Dr. Glennon Mac who reported that she had false negative response to her peripheral vision where it was more of like an unreliable test where she initially could see the peripheral spots but then she could not but at the same time patient was starting to get very hot and overall just did not feel well while doing the test so she thinks it could have been a false negative however she agrees of getting an MRI to rule out stroke but otherwise they typically have to repeat these test a second time to ensure there is actually difficulties with the peripheral vision and not just a false test.  Patient has no loss of vision to suggest a venous thrombus and had CTA imaging done recently without any evidence of dissection or aneurysm so do not feel like repeat CTA would be useful.  Patient was given some IV Ativan to help facilitate MRI and she reports that she will get a ride home.  Patient will be handed off to oncoming team pending these results ? ?MRI, MRV were negative.  Some concerns for some sinusitis so we will place her on Augmentin given she does report some sinus pressure and sinus aches and congestion. ? ?Given reassuring work-up patient is going to follow-up with neurology outpatient she reports near resolution of symptoms after migraine cocktail. ? ?FINAL CLINICAL IMPRESSION(S) / ED DIAGNOSES  ? ?Final diagnoses:  ?Paresthesia  ?Other migraine without status migrainosus, intractable  ? ? ? ?Rx / DC Orders  ? ?ED Discharge Orders   ? ?      Ordered  ?  amoxicillin-clavulanate (AUGMENTIN) 875-125 MG tablet  2 times daily       ? 02/02/22 1512  ? ?  ?  ? ?  ? ? ? ?Note:  This document was prepared using  Dragon voice recognition software and may include unintentional dictation errors. ?  ?Vanessa Homer, MD ?02/02/22 1453 ? ?  ?Vanessa Cordova, MD ?02/02/22 1512 ? ?

## 2022-02-02 NOTE — ED Notes (Signed)
Pt walked out to provider Funke's desk to notify she is extremely anxious and thinks she might be having a reaction to the compazine; denies difficulty breathing, hives, and nausea but reports sudden extreme anxiety; pt somewhat unsteady; pt assisted back to bed.  ?

## 2022-02-02 NOTE — Discharge Instructions (Signed)
MRIs were reassuring.  We started you on some medication for your sinuses you can return to the ER if you develop worsening symptoms or any other concerns otherwise you call neurology to make a follow-up appointment ?

## 2022-02-02 NOTE — ED Triage Notes (Signed)
Pt comes pov from [facility]. Pt went to eye doctor for some starriness in her vision. Eye doctor sent pt to [facility] neuro. Neuro sent pt here. Pt has some right sided numbness and tingling. Woke up with these symptoms but seems to be progressing. Pt has hx of complex migraines.  ?

## 2022-02-05 ENCOUNTER — Ambulatory Visit: Admission: RE | Admit: 2022-02-05 | Payer: 59 | Source: Ambulatory Visit

## 2022-02-10 ENCOUNTER — Ambulatory Visit: Payer: 59

## 2022-02-10 DIAGNOSIS — R202 Paresthesia of skin: Secondary | ICD-10-CM | POA: Diagnosis not present

## 2022-02-10 DIAGNOSIS — F418 Other specified anxiety disorders: Secondary | ICD-10-CM | POA: Diagnosis not present

## 2022-02-11 ENCOUNTER — Telehealth: Payer: Self-pay

## 2022-02-11 NOTE — Telephone Encounter (Signed)
LVM for patient to call back this morning to set up her eval she missed yesterday. Offering 05/31 @ 2:30 or 06/01 @ 8:45

## 2022-02-16 ENCOUNTER — Ambulatory Visit: Payer: 59 | Attending: Infectious Diseases

## 2022-02-16 DIAGNOSIS — R262 Difficulty in walking, not elsewhere classified: Secondary | ICD-10-CM | POA: Insufficient documentation

## 2022-02-16 DIAGNOSIS — R2681 Unsteadiness on feet: Secondary | ICD-10-CM | POA: Diagnosis not present

## 2022-02-16 DIAGNOSIS — R42 Dizziness and giddiness: Secondary | ICD-10-CM | POA: Diagnosis not present

## 2022-02-16 NOTE — Therapy (Signed)
[place] [facility] SERVICES [address] [place], [place], [address] Phone: [phone]   Fax:  [phone]  Physical Therapy Evaluation  Patient Details  Name: Caitlin Yu MRN: [id number] Date of Birth: [birth year] Referring Provider (PT): Leonel Ramsay, MD   Encounter Date: 02/16/2022   PT End of Session - 02/16/22 1723     Visit Number 1    Number of Visits 25    Date for PT Re-Evaluation 05/11/22    Authorization Time Period 02/16/2022-05/11/2022    PT Start Time 1432    PT Stop Time 1536    PT Time Calculation (min) 64 min    Equipment Utilized During Treatment Gait belt    Activity Tolerance Patient tolerated treatment well    Behavior During Therapy [facility] for tasks assessed/performed             Past Medical History:  Diagnosis Date   Anxiety    Hypotension    Migraine     Past Surgical History:  Procedure Laterality Date   HYSTERECTOMY ABDOMINAL WITH SALPINGECTOMY  2012    There were no vitals filed for this visit.    Subjective Assessment - 02/16/22 1430     Subjective Pt is a pleasant 51 y/o female presenting to PT with reports of dizziness and unsteadiness. Pt with hx of vertigo/dizziness starting in 2018, which pt believes followed a viral illness. Other sx at the time included staggering with running (used to run 3-5 mile often). Pt thinks she was seen by ENT or neurology. Per pt she was treated with magnesium and medication for migraines. She reports symptoms went away. Pt now with new onset of dizziness as of 3 weeks ago.  Her symptoms are "sort of like spinning" but are brief so pt is unsure how to describe them. Dizziness lasts no longer than 30 seconds. Pt states it can "feel like the ground is coming out." She endorses scrolling her phone makes her feel nauseated. She reports symptoms with grocery shopping, turning her head or gazing quickly to the L. Pt reports unlike last time she experienced dizziness,  this time she has R facial sensitivity and occasional drooping. She feels as if R side of her face is swollen. Pt has gone to the ER 2x due to change in R facial sensation and drooping. Pt had CT, including CT Angio head and neck, which were unremarkable for infarct, aneurysm and stenosis. MRI was also unremarkable.  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      Pt recently joined a running club and is concerned running might be related to her symptoms (has now discontinued running). Pt with hx of neck stiffness/pain which she has been treated for by PT in the past. Pt has headaches 1-2x/week. She grinds her teeth at night and occasionally wears a night guard. She reports interrupted sleep most nights. Pt is not currently running as she is concerned this is contributing to her symptoms. 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    Pertinent History Pt is a pleasant 51 y/o female presenting to PT with reports of dizziness and unsteadiness. Pt with hx of vertigo/dizziness starting in 2018, which pt believes followed a viral illness. Other sx at the time included staggering with running (used to run 3-5 mile often). Pt thinks she was seen by ENT or neurology. Per pt she  was treated with magnesium and medication for migraines. She reports symptoms went away. Pt now with new onset of dizziness as of 3 weeks ago.  Her symptoms are "sort of like spinning" but are brief so pt is unsure how to describe them. Dizziness lasts no longer than 30 seconds. Pt states it can "feel like the ground is coming out." She endorses scrolling her phone makes her feel nauseated. She reports symptoms with grocery shopping, turning her head or gazing quickly to the L. Pt reports unlike last time she experienced dizziness, this time she has R facial sensitivity and occasional drooping. She feels as if R side of her  face is swollen. Pt has gone to the ER 2x due to change in R facial sensation and drooping. Pt had CT, including CT Angio head and neck, which were unremarkable for infarct, aneurysm and stenosis. MRI was also unremarkable.  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 Pt recently joined a running club and is concerned running might be related to her symptoms (has now discontinued running). Pt with hx of neck stiffness/pain which she has been treated for by PT in the past. Pt has headaches 1-2x/week. She grinds her teeth at night and occasionally wears a night guard. She reports interrupted sleep most nights. Pt is not currently running as she is concerned this is contributing to her symptoms. 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 PMH significant for migraines, orthostatic hypotension, mixed anxiety depressive disorder, other disorder of optic nerve, cranial nerve palsy, allergic rhinitis, insomnia    Limitations Reading;Walking;House hold activities    Diagnostic tests Pt had CT, including CT Angio head and neck, which were unremarkable for infarct, aneurysm and stenosis. MRI was also unremarkable.  Refer to chart for details    Patient Stated Goals Reduce dizziness, return to activities    Currently in Pain? 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 See subjective for details.   Description of dizziness: (vertigo, unsteadiness, lightheadedness, falling, general unsteadiness, whoozy, swimmy-headed sensation, aural fullness): Pt states dizziness is "almost like vertigo." Pt endorses lightheadedness (pt has low blood pressure, chronic). Pt reports aural fullness typically on R side, and unsteadiness.  Frequency: Was happening frequently throughout the day, now dizziness occurs a couple of  times per day.  Duration: 30 seconds to just under a minute  Symptom nature: (motion provoked, positional, spontaneous, constant, variable, intermittent): Pt thinks rolling could bring on her symptoms. Pt reports symptoms when she looks to the L. Pt reports nausea with scrolling on her phone, or at times when watching TV. Pt can experience symptoms at rest following "lots of activities," but no symptoms at rest if she was not recently active.   Easing Factors: holding onto something, sitting down, taking deep breaths   Progression of symptoms: (better, worse, no change since onset):  Symptoms have improved since ER visit.  History of similar episodes: Pt with similar episode in 2018 with running/possibly following viral illness. Pt was prescribed magnesium and Migravent at the time. She reports this helped. When pt was being treated for her neck she reports symptoms were "set off during PT"  where she experienced facial drooping and dizziness.   Falls (yes/no): none  Auditory complaints (tinnitus, pain, drainage, hearing loss, aural fullness): Hx of "ringing in my ears." Reports ringing is worse on R side than L. She has had this for "a long time but it has become  more intense." Pt has not had hearing tested since 2018/19. She has not noticed a difference in her hearing. Pt has allergies and states, "my nose is always running." Pt endorses aural fullness.   Vision (diplopia, visual field loss, recent changes, last eye exam):  Pt wears glasses. She experiences double vision looking to L and R (neurologist aware). 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 Pt reports she was told to expect double vision to "change from time to time."   Red Flags: (dysarthria, dysphagia, drop attacks, bowel and bladder changes, recent weight loss/gain) Review of systems negative for red flags.  Pt denies red flags.   POSTURE: Slight forward head posture    SOMATOSENSORY:  Impaired sensation R side of face with symptom increase. Denies N/T or change in sensation elsewhere.     COORDINATION: Finger to Nose: Normal Heel to Shin: Normal Rapid Alternating Movements: Normal   MUSCULOSKELETAL SCREEN:  ROM:  Cervical  Observed limitations with the following: Bilat rotation (painful to R) Bilat lateral flexion Cervical extension is impaired - pt reports soreness with movement and some dizziness with tilting head back Flexion is pain limited  MMT:  deferred  Functional Mobility:  Gait: Scanning of visual environment with gait is impaired due to dizziness, placing pt at risk of increased falls  BP:  Resting, seated: 104/81 mmHg HR 58 bpm     OCULOMOTOR / VESTIBULAR TESTING:  Oculomotor Exam- Room Light  Findings Comments  Ocular Alignment normal   Ocular ROM normal   Spontaneous Nystagmus not examined   Gaze-Holding Nystagmus normal   End-Gaze Nystagmus normal   Vergence (normal 2-3") abnormal Sees double >1 ft away/makes dizzy (being followed by neuro for double vision)  Smooth Pursuit abnormal Makes patient feel dizzy, rates 4/10 dizziness with testing      Saccades abnormal Corrective saccade, hypometric, dizziness reported      Left Head  Impulse deferred   Right Head Impulse deferred   Static Acuity normal   Dynamic Acuity abnormal Line 10 to line 7     BPPV TESTS: DEFERRED  FUNCTIONAL OUTCOME MEASURES   Results Comments  DHI 54/100    FOTO: 51 (goal score 58)   Instructed pt in HEP: Pt performed one set of the following in standing with CGA. However, instructed to perform seated  at home. Pt verbalized understanding and demonstrated correct technique.  Access Code: [email or web address] URL: [email or web address] Date: 02/16/2022 Prepared by: Ricard Dillon  Exercises - Seated Gaze Stabilization with Head Nod  - 1 x daily - 7 x weekly - 3 sets - 1 reps - 60 hold - Seated Gaze Stabilization with Head Rotation  - 1 x daily - 7 x weekly - 3 sets - 1 reps - 60 hold  Issued and instructed pt in [employer] Symptoms with Exercise fact sheet.    ASSESSMENT Clinical Impression: Pt is a pleasant 51 year-old female referred for dizziness with additional concerns of unsteadiness and imbalance. PT examination reveals abnormal smooth pursuits, vergence, saccades, and DVA testing. Abnormal DVA with 5-6/10 reported dizziness is highly suggestive of gaze instability as contributing factor to pt's symptoms. Pt with history of visual impairment affecting eye muscle/causing double vision (being followed for by neuro). Pt with gross impairment of cervical ROM that is also pain-limited. DHI score indicates moderate perception of handicap due to dizziness. Pt reported feeling drooping sensation on R side of face following testing. However, this improved with rest, unclear if any drooping present.  Pt's BP was taken and found to be 104/81 mmHg. With rest pt felt OK by end of session and observed to be steady with gait. Pt with hx of impaired R facial sensation with drooping (per chart concern for complicated migraine). PT did instruct pt to seek emergency care should symptoms return and worsen, and/or occur with  other symptoms such as weakness, visual changes, difficulty with speech. Pt verbalized understanding. Seated VORx1 with vertical and horizontal head turns issued on this date for HEP. The pt will benefit from further skilled PT to improve dizziness, balance, mobility and pain in order to increase QOL and reduce fall risk.    Pt educated throughout session about proper posture and technique with exercises. Improved exercise technique, movement at target joints, use of target muscles after min to mod verbal, visual, tactile cues.  Rationale for Evaluation and Treatment Rehabilitation     PT Education - 02/16/22 1722     Education Details assessment, goals, plan, HEP    Person(s) Educated Patient    Methods Explanation;Demonstration;Verbal cues;Handout    Comprehension Verbalized understanding;Returned demonstration;Need further instruction              PT Short Term Goals - 02/16/22 1716       PT SHORT TERM GOAL #1   Title Pt will be independent with HEP in order to improve dizziness symptoms and balance in order to decrease fall risk and improve function at home and work.    Baseline 5/31: given    Time 6    Period Weeks    Status New    Target Date 03/30/22               PT Long Term Goals - 02/16/22 1717       PT LONG TERM GOAL #1   Title Patient will increase FOTO score to equal to or greater than  58  to demonstrate statistically significant improvement in mobility and quality of life.    Baseline 5/31: 51    Time 12    Period Weeks    Status New    Target Date 05/11/22      PT LONG TERM GOAL #2   Title Pt will decrease DHI score by at least 18 points in order to demonstrate clinically significant reduction in disability  Baseline 5/31: 54/100    Time 12    Period Weeks    Status New    Target Date 05/11/22      PT LONG TERM GOAL #3   Title Pt will improve DGI by at least 3 points in order to demonstrate clinically significant improvement in balance  and decreased risk for falls.    Baseline 5/31: to be tested next 1-2 visits    Time 12    Period Weeks    Status New    Target Date 05/11/22      PT LONG TERM GOAL #4   Title Pt will report at least a 50% reduction in dizziness symptoms with watching TV, scrolling her phone, and grocery shopping.    Baseline 5/31: activities increase dizziness    Time 12    Period Weeks    Status New    Target Date 05/11/22                    Plan - 02/16/22 1724     Clinical Impression Statement Pt is a pleasant 51 year-old female referred for dizziness with additional concerns of unsteadiness and imbalance. PT examination reveals abnormal smooth pursuits, vergence, saccades, and DVA testing. Abnormal DVA with 5-6/10 reported dizziness is highly suggestive of gaze instability as contributing factor to pt's symptoms. Pt with history of visual impairment affecting eye muscle/causing double vision (being followed for by neuro). Pt with gross impairment of cervical ROM that is also pain-limited. DHI score indicates moderate perception of handicap due to dizziness. Pt reported feeling drooping sensation on R side of face following testing. However, this improved with rest, unclear if any drooping present.  Pt's BP was taken and found to be 104/81 mmHg. With rest pt felt OK by end of session and observed to be steady with gait. Pt with hx of impaired R facial sensation with drooping (per chart concern for complex migraine). PT did instruct pt to seek emergency care should symptoms return, worsen, and occur with other symptoms such as weakness, visual changes, difficulty with speech. Pt verbalized understanding. Seated VORx1 with vertical and horizontal head turns issued on this date for HEP. The pt will benefit from further skilled PT to improve dizziness, balance, mobility and pain in order to increase QOL and reduce fall risk.    Personal Factors and Comorbidities Age;Time since onset of  injury/illness/exacerbation;Profession;Sex;Past/Current Experience;Comorbidity 3+    Comorbidities Per chart PMH significant for: migraines, orthostatic hypotension, anxiety depressive disorder, other disorder of optic nerve, cranial nerve palsy, insomnia    Examination-Activity Limitations Locomotion Level;Bed Mobility;Stairs;Bend;Reach Overhead;Sleep   ADLs and work generally impacted by symptoms when increased   Examination-Participation Restrictions Community Activity;Shop;Occupation;Yard Work   ADLs and work generally impacted by symptoms when increased   Stability/Clinical Decision Making Evolving/Moderate complexity    Clinical Decision Making High    Rehab Potential Good    PT Frequency 2x / week    PT Duration 12 weeks    PT Treatment/Interventions ADLs/Self Care Home Management;Canalith Repostioning;Cryotherapy;Electrical Stimulation;Moist Heat;Ultrasound;DME Instruction;Gait training;Stair training;Functional mobility training;Therapeutic activities;Therapeutic exercise;Balance training;Neuromuscular re-education;Patient/family education;Orthotic Fit/Training;Manual techniques;Passive range of motion;Dry needling;Energy conservation;Splinting;Taping;Vestibular;Visual/perceptual remediation/compensation    PT Next Visit Plan further assessment as indicated, DGI, VOR, balance, MMT    PT Home Exercise Plan Access Code: [email or web address]    Consulted and Agree with Plan of Care Patient             Patient will benefit from skilled therapeutic intervention in order to improve  the following deficits and impairments:  Abnormal gait, Dizziness, Hypomobility, Decreased range of motion, Decreased balance, Decreased mobility, Pain, Impaired sensation, Postural dysfunction, Impaired vision/preception, Increased muscle spasms, Impaired flexibility, Difficulty walking, Decreased coordination, Improper body mechanics, Decreased strength, Decreased activity tolerance  Visit Diagnosis: Dizziness and  giddiness  Unsteadiness on feet  Difficulty in walking, not elsewhere classified     Problem List There are no problems to display for this patient.   Zollie Pee, PT 02/16/2022, 5:42 PM  [place] [facility] SERVICES [address] [place], [place], [address] Phone: [phone]   Fax:  [phone]  Name: Caitlin Yu MRN: [id number] Date of Birth: 1971-01-13

## 2022-02-17 ENCOUNTER — Ambulatory Visit: Payer: 59

## 2022-02-22 ENCOUNTER — Ambulatory Visit: Payer: 59 | Attending: Infectious Diseases

## 2022-02-22 DIAGNOSIS — M6281 Muscle weakness (generalized): Secondary | ICD-10-CM | POA: Insufficient documentation

## 2022-02-22 DIAGNOSIS — R42 Dizziness and giddiness: Secondary | ICD-10-CM | POA: Insufficient documentation

## 2022-02-22 DIAGNOSIS — R262 Difficulty in walking, not elsewhere classified: Secondary | ICD-10-CM | POA: Insufficient documentation

## 2022-02-22 DIAGNOSIS — R2681 Unsteadiness on feet: Secondary | ICD-10-CM | POA: Insufficient documentation

## 2022-02-24 ENCOUNTER — Ambulatory Visit: Payer: 59

## 2022-02-28 ENCOUNTER — Ambulatory Visit: Payer: 59

## 2022-02-28 DIAGNOSIS — M6281 Muscle weakness (generalized): Secondary | ICD-10-CM | POA: Diagnosis not present

## 2022-02-28 DIAGNOSIS — R2681 Unsteadiness on feet: Secondary | ICD-10-CM | POA: Diagnosis not present

## 2022-02-28 DIAGNOSIS — R42 Dizziness and giddiness: Secondary | ICD-10-CM | POA: Diagnosis not present

## 2022-02-28 DIAGNOSIS — R262 Difficulty in walking, not elsewhere classified: Secondary | ICD-10-CM | POA: Diagnosis not present

## 2022-02-28 NOTE — Therapy (Signed)
[place] MAIN [facility] SERVICES [address] [place], [place], [address] Phone: [phone]   Fax:  [phone]  Physical Therapy Treatment  Patient Details  Name: Caitlin Yu MRN: [id number] Date of Birth: [birth year] Referring Provider (PT): Leonel Ramsay, MD   Encounter Date: 02/28/2022   PT End of Session - 03/01/22 0817     Visit Number 2    Number of Visits 25    Date for PT Re-Evaluation 05/11/22    Authorization Time Period 02/16/2022-05/11/2022    PT Start Time 1602    PT Stop Time 1645    PT Time Calculation (min) 43 min    Equipment Utilized During Treatment Gait belt    Activity Tolerance Patient tolerated treatment well    Behavior During Therapy [facility] for tasks assessed/performed             Past Medical History:  Diagnosis Date   Anxiety    Hypotension    Migraine     Past Surgical History:  Procedure Laterality Date   HYSTERECTOMY ABDOMINAL WITH SALPINGECTOMY  2012    There were no vitals filed for this visit.   Subjective Assessment - 02/28/22 1605     Subjective Pt felt OK following testing last appointment although she reports also feeling "different." Pt reports she has a little bit of increased sensation in her face today. However says not as bad as usual. Pt states, "some days are fine and some days are really hard." Pt went kayaking over the weekend and didn't experience any symptoms.    Pertinent History Pt is a pleasant 51 y/o female presenting to PT with reports of dizziness and unsteadiness. Pt with hx of vertigo/dizziness starting in 2018, which pt believes followed a viral illness. Other sx at the time included staggering with running (used to run 3-5 mile often). Pt thinks she was seen by ENT or neurology. Per pt she was treated with magnesium and medication for migraines. She reports symptoms went away. Pt now with new onset of dizziness as of 3 weeks ago.  Her symptoms are "sort of like spinning"  but are brief so pt is unsure how to describe them. Dizziness lasts no longer than 30 seconds. Pt states it can "feel like the ground is coming out." She endorses scrolling her phone makes her feel nauseated. She reports symptoms with grocery shopping, turning her head or gazing quickly to the L. Pt reports unlike last time she experienced dizziness, this time she has R facial sensitivity and occasional drooping. She feels as if R side of her face is swollen. Pt has gone to the ER 2x due to change in R facial sensation and drooping. Pt had CT, including CT Angio head and neck, which were unremarkable for infarct, aneurysm and stenosis. MRI was also unremarkable.  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 Pt recently joined a running club and is concerned running might be related to her symptoms (has now discontinued running). Pt with hx of neck stiffness/pain which she has been treated for by PT in the past. Pt has headaches 1-2x/week. She grinds her teeth at night and occasionally wears a night guard. She reports interrupted sleep most nights. Pt is not currently running as she is concerned this is contributing to her symptoms. 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 PMH significant for migraines, orthostatic hypotension, mixed  anxiety depressive disorder, other disorder of optic nerve, cranial nerve palsy, allergic rhinitis, insomnia    Limitations Reading;Walking;House hold activities    Diagnostic tests Pt had CT, including CT Angio head and neck, which were unremarkable for infarct, aneurysm and stenosis. MRI was also unremarkable.  Refer to chart for details    Patient Stated Goals Reduce dizziness, return to activities    Currently in Pain? No/denies              INTERVENTIONS   DGI: 21/24 Comments: Dizziness reaches 2/10 with testing  Recovery interval taken during  test due to dizziness. Improves with rest.  MMT: UE - grossly 4-/5 B LE - grossly 4/5 B, knee extension 4-/5 B  VOR: 10 ft from target, standing, VORx1, horizontal head turns, plain background 1x40 sec, 3x30 sec. Cuing to decrease speed of head turns due to symptom increase.  Noted corrective saccades to L side. Pt states "not as bad with the second set" 10 ft from target, standing, VORx1, vertical head turns, plain background 2x30 sec Cuing for pt to focus on stable point for rest interval for symptom modulation. This improves dizziness.  Seated scapular squeezes 10x with 3 sec holds  Seated shoulder external rotation with RTB  20x, 15x bilat   Nustep lvl 0 x 1 min Lvl 4 x 4 min  Monitored throughout for response to intervention. Mild increase in dizziness. Instructed pt to focus on stable point for symptom modulation.  Standing hip abduction 12x bilat  Instructed pt in addition to HEP Access Code: [email or web address] URL: [email or web address] Date: 02/28/2022 Prepared by: Ricard Dillon  Exercises - Shoulder External Rotation and Scapular Retraction with Resistance  - 1 x daily - 5 x weekly - 3 sets - 10 reps   Pt educated throughout session about proper posture and technique with exercises. Improved exercise technique, movement at target joints, use of target muscles after min to mod verbal, visual, tactile cues.  Rationale for Evaluation and Treatment Rehabilitation    PT Education - 03/01/22 0816     Education Details further assessment, exercise technique, addition to HEP    Person(s) Educated Patient    Methods Explanation;Demonstration;Verbal cues;Handout    Comprehension Verbalized understanding;Returned demonstration;Verbal cues required;Need further instruction              PT Short Term Goals - 02/16/22 1716       PT SHORT TERM GOAL #1   Title Pt will be independent with HEP in order to improve dizziness symptoms and balance in order to decrease fall  risk and improve function at home and work.    Baseline 5/31: given    Time 6    Period Weeks    Status New    Target Date 03/30/22               PT Long Term Goals - 02/16/22 1717       PT LONG TERM GOAL #1   Title Patient will increase FOTO score to equal to or greater than  58  to demonstrate statistically significant improvement in mobility and quality of life.    Baseline 5/31: 51    Time 12    Period Weeks    Status New    Target Date 05/11/22      PT LONG TERM GOAL #2   Title Pt will decrease DHI score by at least 18 points in order to demonstrate clinically significant reduction in disability    Baseline 5/31: 54/100  Time 12    Period Weeks    Status New    Target Date 05/11/22      PT LONG TERM GOAL #3   Title Pt will improve DGI by at least 3 points in order to demonstrate clinically significant improvement in balance and decreased risk for falls.    Baseline 5/31: to be tested next 1-2 visits    Time 12    Period Weeks    Status New    Target Date 05/11/22      PT LONG TERM GOAL #4   Title Pt will report at least a 50% reduction in dizziness symptoms with watching TV, scrolling her phone, and grocery shopping.    Baseline 5/31: activities increase dizziness    Time 12    Period Weeks    Status New    Target Date 05/11/22                   Plan - 03/01/22 5449     Clinical Impression Statement Further assessment completed on this date. Pt DGI score is 21/24, with greatest deficits with head turns, turning. Pt MMT indicates impairment in BUE and BLE strength. PT issued UE strengthening intervention as addition to HEP. The pt will benefit from further skilled PT to improve motion and positionally induced dizziness and to increase QOL.    Personal Factors and Comorbidities Age;Time since onset of injury/illness/exacerbation;Profession;Sex;Past/Current Experience;Comorbidity 3+    Comorbidities Per chart PMH significant for: migraines,  orthostatic hypotension, anxiety depressive disorder, other disorder of optic nerve, cranial nerve palsy, insomnia    Examination-Activity Limitations Locomotion Level;Bed Mobility;Stairs;Bend;Reach Overhead;Sleep   ADLs and work generally impacted by symptoms when increased   Examination-Participation Restrictions Community Activity;Shop;Occupation;Yard Work   ADLs and work generally impacted by symptoms when increased   Stability/Clinical Decision Making Evolving/Moderate complexity    Rehab Potential Good    PT Frequency 2x / week    PT Duration 12 weeks    PT Treatment/Interventions ADLs/Self Care Home Management;Canalith Repostioning;Cryotherapy;Electrical Stimulation;Moist Heat;Ultrasound;DME Instruction;Gait training;Stair training;Functional mobility training;Therapeutic activities;Therapeutic exercise;Balance training;Neuromuscular re-education;Patient/family education;Orthotic Fit/Training;Manual techniques;Passive range of motion;Dry needling;Energy conservation;Splinting;Taping;Vestibular;Visual/perceptual remediation/compensation    PT Next Visit Plan further assessment as indicated, DGI, VOR, balance, MMT    PT Home Exercise Plan Access Code: [email or web address]; 6/13: Access Code: [email or web address]    Consulted and Agree with Plan of Care Patient             Patient will benefit from skilled therapeutic intervention in order to improve the following deficits and impairments:  Abnormal gait, Dizziness, Hypomobility, Decreased range of motion, Decreased balance, Decreased mobility, Pain, Impaired sensation, Postural dysfunction, Impaired vision/preception, Increased muscle spasms, Impaired flexibility, Difficulty walking, Decreased coordination, Improper body mechanics, Decreased strength, Decreased activity tolerance  Visit Diagnosis: Dizziness and giddiness  Unsteadiness on feet  Muscle weakness (generalized)     Problem List There are no problems to display for this patient.   Zollie Pee, PT 03/01/2022, 8:25 AM  [place] MAIN [facility] SERVICES [address] [place], [place], [address] Phone: [phone]   Fax:  [phone]  Name: Caitlin Yu MRN: [id number] Date of Birth: 08/28/1971

## 2022-03-03 ENCOUNTER — Ambulatory Visit: Payer: 59

## 2022-03-08 ENCOUNTER — Ambulatory Visit: Payer: 59

## 2022-03-08 DIAGNOSIS — M6281 Muscle weakness (generalized): Secondary | ICD-10-CM

## 2022-03-08 DIAGNOSIS — R262 Difficulty in walking, not elsewhere classified: Secondary | ICD-10-CM | POA: Diagnosis not present

## 2022-03-08 DIAGNOSIS — R42 Dizziness and giddiness: Secondary | ICD-10-CM | POA: Diagnosis not present

## 2022-03-08 DIAGNOSIS — R2681 Unsteadiness on feet: Secondary | ICD-10-CM

## 2022-03-08 NOTE — Unmapped (Signed)
Formatting of this note is different from the original.  Images from the original note were not included.  [facility]  [facility] MAIN [facility] SERVICES  [address] [place], [place], [address]  Phone: [phone]   Fax:  [phone]    Physical Therapy Treatment    Patient Details   Name: Catherine Bryant  MRN: [id number]  Date of Birth: 30-Nov-1970  Referring Provider (PT): Mick Sell, MD    Encounter Date: 03/08/2022     PT End of Session - 03/08/22 1651       Visit Number 3     Number of Visits 25     Date for PT Re-Evaluation 05/11/22     Authorization Time Period 02/16/2022-05/11/2022     PT Start Time 1347     PT Stop Time 1429     PT Time Calculation (min) 42 min     Equipment Utilized During Treatment Gait belt     Activity Tolerance Patient tolerated treatment well     Behavior During Therapy [employer] for tasks assessed/performed                Past Medical History:   Diagnosis Date    Anxiety     Hypotension     Migraine      Past Surgical History:   Procedure Laterality Date    HYSTERECTOMY ABDOMINAL WITH SALPINGECTOMY  2012     There were no vitals filed for this visit.     Subjective Assessment - 03/08/22 1353       Subjective Pt reports that saturday she had lot of problems, otherwise its been here and there. States that saturday afternoon had "some vertigo and felt wiped out but today feels okay". Reports feeling a little fullness in L side of face, no dizziness at rest.     Pertinent History Pt is a pleasant 51 y/o female presenting to PT with reports of dizziness and unsteadiness. Pt with hx of vertigo/dizziness starting in 2018, which pt believes followed a viral illness. Other sx at the time included staggering with running (used to run 3-5 mile often). Pt thinks she was seen by ENT or neurology. Per pt she was treated with magnesium and medication for migraines. She reports symptoms went away. Pt now with new onset of dizziness as of 3 weeks ago.  Her symptoms are ?sort of  like spinning? but are brief so pt is unsure how to describe them. Dizziness lasts no longer than 30 seconds. Pt states it can ?feel like the ground is coming out.? She endorses scrolling her phone makes her feel nauseated. She reports symptoms with grocery shopping, turning her head or gazing quickly to the L. Pt reports unlike last time she experienced dizziness, this time she has R facial sensitivity and occasional drooping. She feels as if R side of her face is swollen. Pt has gone to the ER 2x due to change in R facial sensation and drooping. Pt had CT, including CT Angio head and neck, which were unremarkable for infarct, aneurysm and stenosis. MRI was also unremarkable.  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 Pt recently joined a running club and is concerned running might be related to her symptoms (has now discontinued running). Pt with hx of neck stiffness/pain which she has been treated for by PT in the past. Pt has headaches 1-2x/week. She grinds her teeth at night  and occasionally wears a night guard. She reports interrupted sleep most nights. Pt is not currently running as she is concerned this is contributing to her symptoms. 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 PMH significant for migraines, orthostatic hypotension, mixed anxiety depressive disorder, other disorder of optic nerve, cranial nerve palsy, allergic rhinitis, insomnia     Limitations Reading;Walking;House hold activities     Diagnostic tests Pt had CT, including CT Angio head and neck, which were unremarkable for infarct, aneurysm and stenosis. MRI was also unremarkable.  Refer to chart for details     Patient Stated Goals Reduce dizziness, return to activities                INTERVENTION  - gait belt donned and CGA throughout unless otherwise stated    Nustep lvl 2 x 1 min  Lvl 4 x 4 min   SPM >  60  Monitored throughout for response to intervention. Mild increase in dizziness. Continued instruction for pt to focus on stable point for symptom modulation.    Standing hip abduction: 2 x 12 bilat, 1.5# AW    Ambulation in hallway to promote dynamic balance: vertical and horizontal head turns, 1x each  Dizziness 3/10 following intervention, recovery interval taken (moved to other room to dimmer lights and pt reports it helped)    Seated scapular squeezes 20x with 3 sec holds, VC to perform with box breathing    Seated shoulder external rotation with RTB 20x    Manual therapy (pt seated):  Noted tightness in B Uts (R > L), TrP noted in R UT. TrP release provided by SPT x 20 sec intervals for multiple bouts of TrP release.    Seated upper trap stretch 60 sec B     Seated cervical rotation stretch 60 sec B    Dizziness 3/10, pt reports improvement with focusing FWD on still point and dimming of lights.      Pt educated throughout session about proper posture and technique with exercises. Improved exercise technique, movement at target joints, use of target muscles after min to mod verbal, visual, tactile cues.    Rationale for Evaluation and Treatment Rehabilitation    Avon Gully, SPT    This entire session was performed under direct supervision and direction of a licensed therapist/therapist assistant . I have personally read, edited and approve of the note as written.  Temple Pacini PT, DPT     PT Education - 03/08/22 1650       Education Details exercise technique, environment modifications for symptoms     Person(s) Educated Patient     Methods Explanation;Demonstration;Verbal cues     Comprehension Verbalized understanding;Returned demonstration                 PT Short Term Goals - 02/16/22 1716         PT SHORT TERM GOAL #1    Title Pt will be independent with HEP in order to improve dizziness symptoms and balance in order to decrease fall risk and improve function at home and work.     Baseline 5/31:  given     Time 6     Period Weeks     Status New     Target Date 03/30/22                 PT Long Term Goals - 02/16/22 1717         PT  LONG TERM GOAL #1    Title Patient will increase FOTO score to equal to or greater than  58  to demonstrate statistically significant improvement in mobility and quality of life.     Baseline 5/31: 51     Time 12     Period Weeks     Status New     Target Date 05/11/22       PT LONG TERM GOAL #2    Title Pt will decrease DHI score by at least 18 points in order to demonstrate clinically significant reduction in disability     Baseline 5/31: 54/100     Time 12     Period Weeks     Status New     Target Date 05/11/22       PT LONG TERM GOAL #3    Title Pt will improve DGI by at least 3 points in order to demonstrate clinically significant improvement in balance and decreased risk for falls.     Baseline 5/31: to be tested next 1-2 visits     Time 12     Period Weeks     Status New     Target Date 05/11/22       PT LONG TERM GOAL #4    Title Pt will report at least a 50% reduction in dizziness symptoms with watching TV, scrolling her phone, and grocery shopping.     Baseline 5/31: activities increase dizziness     Time 12     Period Weeks     Status New     Target Date 05/11/22                 Plan - 03/08/22 1653       Clinical Impression Statement Pt increasingly symptomatic throughout session today. Pt's symtpoms present with some photosensitivity like qualities and pt reports improvement in dizziness following dimming of lights in the room. PT and SPT provided multiple recovery intervals to allow for reduction in symptoms following interventions. Pt will benefit from further skilled PT to decrease dizziness and improve QoL.     Personal Factors and Comorbidities Age;Time since onset of injury/illness/exacerbation;Profession;Sex;Past/Current Experience;Comorbidity 3+     Comorbidities Per chart PMH significant for: migraines, orthostatic hypotension, anxiety depressive disorder,  other disorder of optic nerve, cranial nerve palsy, insomnia     Examination-Activity Limitations Locomotion Level;Bed Mobility;Stairs;Bend;Reach Overhead;Sleep   ADLs and work generally impacted by symptoms when increased    Examination-Participation Restrictions Community Activity;Shop;Occupation;Yard Work   ADLs and work generally impacted by symptoms when increased    Stability/Clinical Decision Making Evolving/Moderate complexity     Rehab Potential Good     PT Frequency 2x / week     PT Duration 12 weeks     PT Treatment/Interventions ADLs/Self Care Home Management;Canalith Repostioning;Cryotherapy;Electrical Stimulation;Moist Heat;Ultrasound;DME Instruction;Gait training;Stair training;Functional mobility training;Therapeutic activities;Therapeutic exercise;Balance training;Neuromuscular re-education;Patient/family education;Orthotic Fit/Training;Manual techniques;Passive range of motion;Dry needling;Energy conservation;Splinting;Taping;Vestibular;Visual/perceptual remediation/compensation     PT Next Visit Plan further assessment as indicated, DGI, VOR, balance, strengthening     PT Home Exercise Plan Access Code: [email or web address]; 6/13: Access Code: [email or web address]     Consulted and Agree with Plan of Care Patient                Patient will benefit from skilled therapeutic intervention in order to improve the following deficits and impairments:  Abnormal gait, Dizziness, Hypomobility, Decreased range of motion, Decreased balance, Decreased mobility, Pain, Impaired sensation, Postural dysfunction, Impaired vision/preception, Increased muscle spasms,  Impaired flexibility, Difficulty walking, Decreased coordination, Improper body mechanics, Decreased strength, Decreased activity tolerance    Visit Diagnosis:  Dizziness and giddiness    Unsteadiness on feet    Difficulty in walking, not elsewhere classified    Muscle weakness (generalized)    Problem List  There are no problems to display for this patient.    Baird Kay,  PT  03/09/2022, 8:15 AM    [facility]  [facility] MAIN [facility] SERVICES  [address] [place], [place], [address]  Phone: [phone]   Fax:  [phone]    Name: Lorielle Zwiebel  MRN: [id number]  Date of Birth: September 01, 1971      Electronically signed by Baird Kay, PT at 03/16/2022 12:01 PM EDT

## 2022-03-08 NOTE — Therapy (Addendum)
[place] MAIN [facility] SERVICES [address] [place], [place], [address] Phone: [phone]   Fax:  [phone]  Physical Therapy Treatment  Patient Details  Name: Caitlin Yu MRN: [id number] Date of Birth: [birth year] Referring Provider (PT): Leonel Ramsay, MD   Encounter Date: 03/08/2022   PT End of Session - 03/08/22 1651     Visit Number 3    Number of Visits 25    Date for PT Re-Evaluation 05/11/22    Authorization Time Period 02/16/2022-05/11/2022    PT Start Time 1347    PT Stop Time 1429    PT Time Calculation (min) 42 min    Equipment Utilized During Treatment Gait belt    Activity Tolerance Patient tolerated treatment well    Behavior During Therapy [facility] for tasks assessed/performed             Past Medical History:  Diagnosis Date   Anxiety    Hypotension    Migraine     Past Surgical History:  Procedure Laterality Date   HYSTERECTOMY ABDOMINAL WITH SALPINGECTOMY  2012    There were no vitals filed for this visit.   Subjective Assessment - 03/08/22 1353     Subjective Pt reports that saturday she had lot of problems, otherwise its been here and there. States that saturday afternoon had "some vertigo and felt wiped out but today feels okay". Reports feeling a little fullness in L side of face, no dizziness at rest.    Pertinent History Pt is a pleasant 51 y/o female presenting to PT with reports of dizziness and unsteadiness. Pt with hx of vertigo/dizziness starting in 2018, which pt believes followed a viral illness. Other sx at the time included staggering with running (used to run 3-5 mile often). Pt thinks she was seen by ENT or neurology. Per pt she was treated with magnesium and medication for migraines. She reports symptoms went away. Pt now with new onset of dizziness as of 3 weeks ago.  Her symptoms are "sort of like spinning" but are brief so pt is unsure how to describe them. Dizziness lasts no longer than  30 seconds. Pt states it can "feel like the ground is coming out." She endorses scrolling her phone makes her feel nauseated. She reports symptoms with grocery shopping, turning her head or gazing quickly to the L. Pt reports unlike last time she experienced dizziness, this time she has R facial sensitivity and occasional drooping. She feels as if R side of her face is swollen. Pt has gone to the ER 2x due to change in R facial sensation and drooping. Pt had CT, including CT Angio head and neck, which were unremarkable for infarct, aneurysm and stenosis. MRI was also unremarkable.  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 Pt recently joined a running club and is concerned running might be related to her symptoms (has now discontinued running). Pt with hx of neck stiffness/pain which she has been treated for by PT in the past. Pt has headaches 1-2x/week. She grinds her teeth at night and occasionally wears a night guard. She reports interrupted sleep most nights. Pt is not currently running as she is concerned this is contributing to her symptoms. 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 PMH significant for migraines, orthostatic hypotension, mixed anxiety depressive disorder, other disorder of optic nerve, cranial nerve palsy, allergic rhinitis,  insomnia    Limitations Reading;Walking;House hold activities    Diagnostic tests Pt had CT, including CT Angio head and neck, which were unremarkable for infarct, aneurysm and stenosis. MRI was also unremarkable.  Refer to chart for details    Patient Stated Goals Reduce dizziness, return to activities             INTERVENTION  - gait belt donned and CGA throughout unless otherwise stated  Nustep lvl 2 x 1 min Lvl 4 x 4 min  SPM > 60 Monitored throughout for response to intervention. Mild increase in dizziness. Continued instruction  for pt to focus on stable point for symptom modulation.  Standing hip abduction: 2 x 12 bilat, 1.5# AW  Ambulation in hallway to promote dynamic balance: vertical and horizontal head turns, 1x each Dizziness 3/10 following intervention, recovery interval taken (moved to other room to dimmer lights and pt reports it helped)  Seated scapular squeezes 20x with 3 sec holds, VC to perform with box breathing  Seated shoulder external rotation with RTB 20x  Manual therapy (pt seated): Noted tightness in B Uts (R > L), TrP noted in R UT. TrP release provided by SPT x 20 sec intervals for multiple bouts of TrP release.  Seated upper trap stretch 60 sec B   Seated cervical rotation stretch 60 sec B  Dizziness 3/10, pt reports improvement with focusing FWD on still point and dimming of lights.    Pt educated throughout session about proper posture and technique with exercises. Improved exercise technique, movement at target joints, use of target muscles after min to mod verbal, visual, tactile cues.  Rationale for Evaluation and Treatment Rehabilitation  Izola Price, SPT  This entire session was performed under direct supervision and direction of a licensed therapist/therapist assistant . I have personally read, edited and approve of the note as written. Ricard Dillon PT, DPT     PT Education - 03/08/22 1650     Education Details exercise technique, environment modifications for symptoms    Person(s) Educated Patient    Methods Explanation;Demonstration;Verbal cues    Comprehension Verbalized understanding;Returned demonstration              PT Short Term Goals - 02/16/22 1716       PT SHORT TERM GOAL #1   Title Pt will be independent with HEP in order to improve dizziness symptoms and balance in order to decrease fall risk and improve function at home and work.    Baseline 5/31: given    Time 6    Period Weeks    Status New    Target Date 03/30/22                PT Long Term Goals - 02/16/22 1717       PT LONG TERM GOAL #1   Title Patient will increase FOTO score to equal to or greater than  58  to demonstrate statistically significant improvement in mobility and quality of life.    Baseline 5/31: 51    Time 12    Period Weeks    Status New    Target Date 05/11/22      PT LONG TERM GOAL #2   Title Pt will decrease DHI score by at least 18 points in order to demonstrate clinically significant reduction in disability    Baseline 5/31: 54/100    Time 12    Period Weeks    Status New    Target Date 05/11/22  PT LONG TERM GOAL #3   Title Pt will improve DGI by at least 3 points in order to demonstrate clinically significant improvement in balance and decreased risk for falls.    Baseline 5/31: to be tested next 1-2 visits    Time 12    Period Weeks    Status New    Target Date 05/11/22      PT LONG TERM GOAL #4   Title Pt will report at least a 50% reduction in dizziness symptoms with watching TV, scrolling her phone, and grocery shopping.    Baseline 5/31: activities increase dizziness    Time 12    Period Weeks    Status New    Target Date 05/11/22                   Plan - 03/08/22 1653     Clinical Impression Statement Pt increasingly symptomatic throughout session today. Pt's symtpoms present with some photosensitivity like qualities and pt reports improvement in dizziness following dimming of lights in the room. PT and SPT provided multiple recovery intervals to allow for reduction in symptoms following interventions. Pt will benefit from further skilled PT to decrease dizziness and improve QoL.    Personal Factors and Comorbidities Age;Time since onset of injury/illness/exacerbation;Profession;Sex;Past/Current Experience;Comorbidity 3+    Comorbidities Per chart PMH significant for: migraines, orthostatic hypotension, anxiety depressive disorder, other disorder of optic nerve, cranial nerve palsy, insomnia     Examination-Activity Limitations Locomotion Level;Bed Mobility;Stairs;Bend;Reach Overhead;Sleep   ADLs and work generally impacted by symptoms when increased   Examination-Participation Restrictions Community Activity;Shop;Occupation;Yard Work   ADLs and work generally impacted by symptoms when increased   Stability/Clinical Decision Making Evolving/Moderate complexity    Rehab Potential Good    PT Frequency 2x / week    PT Duration 12 weeks    PT Treatment/Interventions ADLs/Self Care Home Management;Canalith Repostioning;Cryotherapy;Electrical Stimulation;Moist Heat;Ultrasound;DME Instruction;Gait training;Stair training;Functional mobility training;Therapeutic activities;Therapeutic exercise;Balance training;Neuromuscular re-education;Patient/family education;Orthotic Fit/Training;Manual techniques;Passive range of motion;Dry needling;Energy conservation;Splinting;Taping;Vestibular;Visual/perceptual remediation/compensation    PT Next Visit Plan further assessment as indicated, DGI, VOR, balance, strengthening    PT Home Exercise Plan Access Code: [email or web address]; 6/13: Access Code: [email or web address]    Consulted and Agree with Plan of Care Patient             Patient will benefit from skilled therapeutic intervention in order to improve the following deficits and impairments:  Abnormal gait, Dizziness, Hypomobility, Decreased range of motion, Decreased balance, Decreased mobility, Pain, Impaired sensation, Postural dysfunction, Impaired vision/preception, Increased muscle spasms, Impaired flexibility, Difficulty walking, Decreased coordination, Improper body mechanics, Decreased strength, Decreased activity tolerance  Visit Diagnosis: Dizziness and giddiness  Unsteadiness on feet  Difficulty in walking, not elsewhere classified  Muscle weakness (generalized)     Problem List There are no problems to display for this patient.   Zollie Pee, PT 03/09/2022, 8:15 AM  [place] MAIN [facility] SERVICES [address] [place], [place], [address] Phone: [phone]   Fax:  [phone]  Name: Caitlin Yu MRN: [id number] Date of Birth: Jan 16, 1971

## 2022-03-15 ENCOUNTER — Ambulatory Visit: Payer: 59

## 2022-03-16 ENCOUNTER — Ambulatory Visit: Payer: 59

## 2022-03-16 ENCOUNTER — Other Ambulatory Visit: Payer: Self-pay

## 2022-03-23 ENCOUNTER — Other Ambulatory Visit: Payer: Self-pay

## 2022-03-23 DIAGNOSIS — Z03818 Encounter for observation for suspected exposure to other biological agents ruled out: Secondary | ICD-10-CM | POA: Diagnosis not present

## 2022-03-23 DIAGNOSIS — J019 Acute sinusitis, unspecified: Secondary | ICD-10-CM | POA: Diagnosis not present

## 2022-03-23 DIAGNOSIS — J040 Acute laryngitis: Secondary | ICD-10-CM | POA: Diagnosis not present

## 2022-03-23 DIAGNOSIS — H66001 Acute suppurative otitis media without spontaneous rupture of ear drum, right ear: Secondary | ICD-10-CM | POA: Diagnosis not present

## 2022-03-23 DIAGNOSIS — B9689 Other specified bacterial agents as the cause of diseases classified elsewhere: Secondary | ICD-10-CM | POA: Diagnosis not present

## 2022-03-23 MED ORDER — BENZONATATE 200 MG PO CAPS
ORAL_CAPSULE | ORAL | 0 refills | Status: AC
  Filled 2022-03-23: qty 7, 2d supply, fill #0
  Filled 2022-03-23: qty 13, 5d supply, fill #0

## 2022-03-23 MED ORDER — AZITHROMYCIN 250 MG PO TABS
ORAL_TABLET | ORAL | 0 refills | Status: AC
  Filled 2022-03-23: qty 6, 5d supply, fill #0

## 2022-03-23 MED ORDER — PREDNISONE 20 MG PO TABS
ORAL_TABLET | ORAL | 0 refills | Status: AC
  Filled 2022-03-23: qty 10, 5d supply, fill #0

## 2022-03-24 ENCOUNTER — Ambulatory Visit: Payer: 59 | Attending: Infectious Diseases

## 2022-03-28 ENCOUNTER — Ambulatory Visit: Payer: 59

## 2022-04-04 ENCOUNTER — Ambulatory Visit: Payer: 59

## 2022-04-11 ENCOUNTER — Ambulatory Visit: Payer: 59

## 2022-04-18 ENCOUNTER — Ambulatory Visit: Payer: 59

## 2022-04-25 ENCOUNTER — Ambulatory Visit: Payer: 59

## 2022-05-02 ENCOUNTER — Ambulatory Visit: Payer: 59

## 2022-05-09 ENCOUNTER — Ambulatory Visit: Payer: 59

## 2022-05-17 ENCOUNTER — Other Ambulatory Visit: Payer: Self-pay

## 2022-05-19 ENCOUNTER — Other Ambulatory Visit: Payer: Self-pay

## 2022-05-19 MED ORDER — ALBUTEROL SULFATE HFA 108 (90 BASE) MCG/ACT IN AERS
2.0000 | INHALATION_SPRAY | Freq: Four times a day (QID) | RESPIRATORY_TRACT | 0 refills | Status: AC | PRN
  Filled 2022-05-19: qty 6.7, 30d supply, fill #0

## 2022-12-08 IMAGING — CR DG CHEST 2V
2 series · 2 of 2 positions shown · non-contrast
Comparison: None.

CLINICAL DATA: Shortness of breath for 5 days.

EXAM:
CHEST - 2 VIEW

[chest pa]
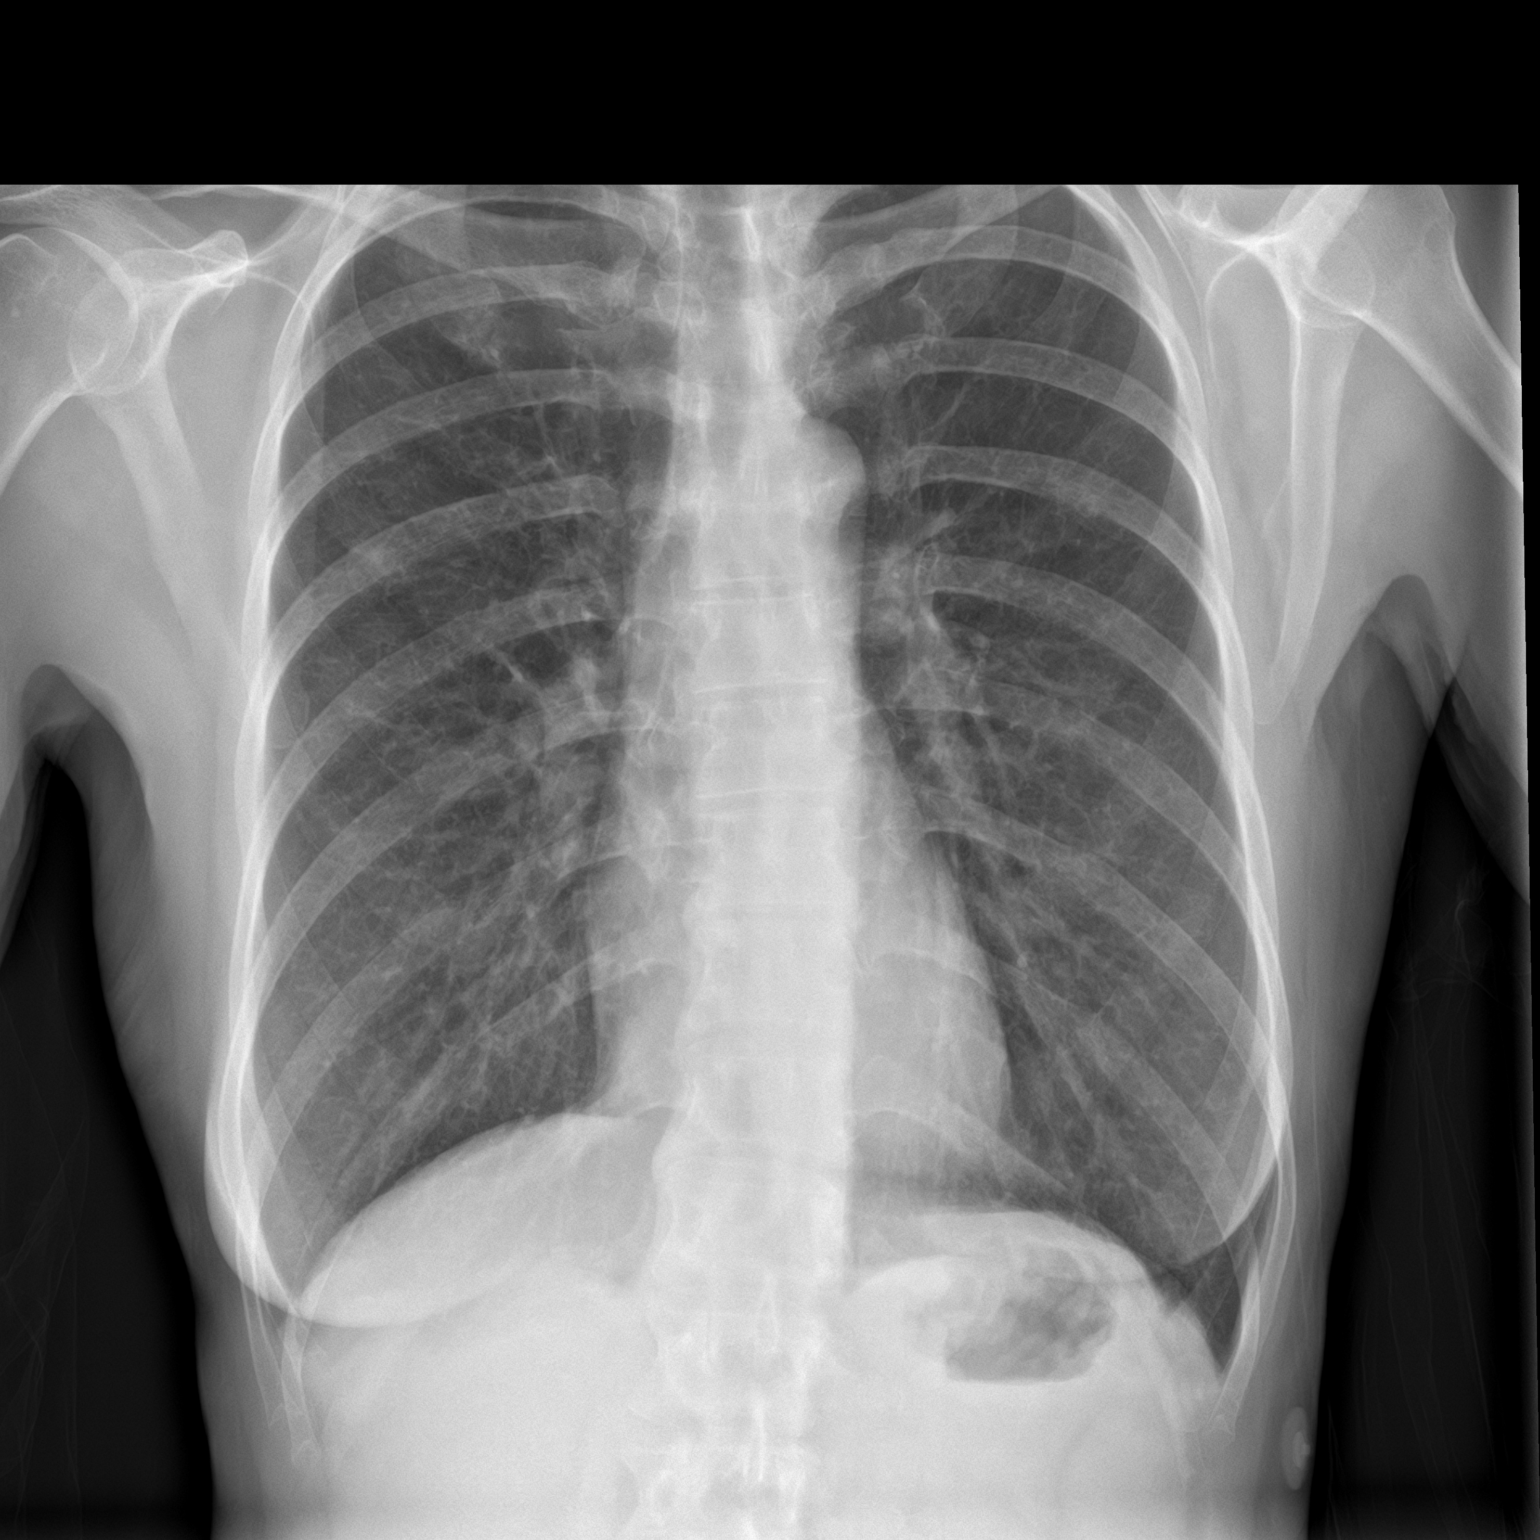

[chest lat]
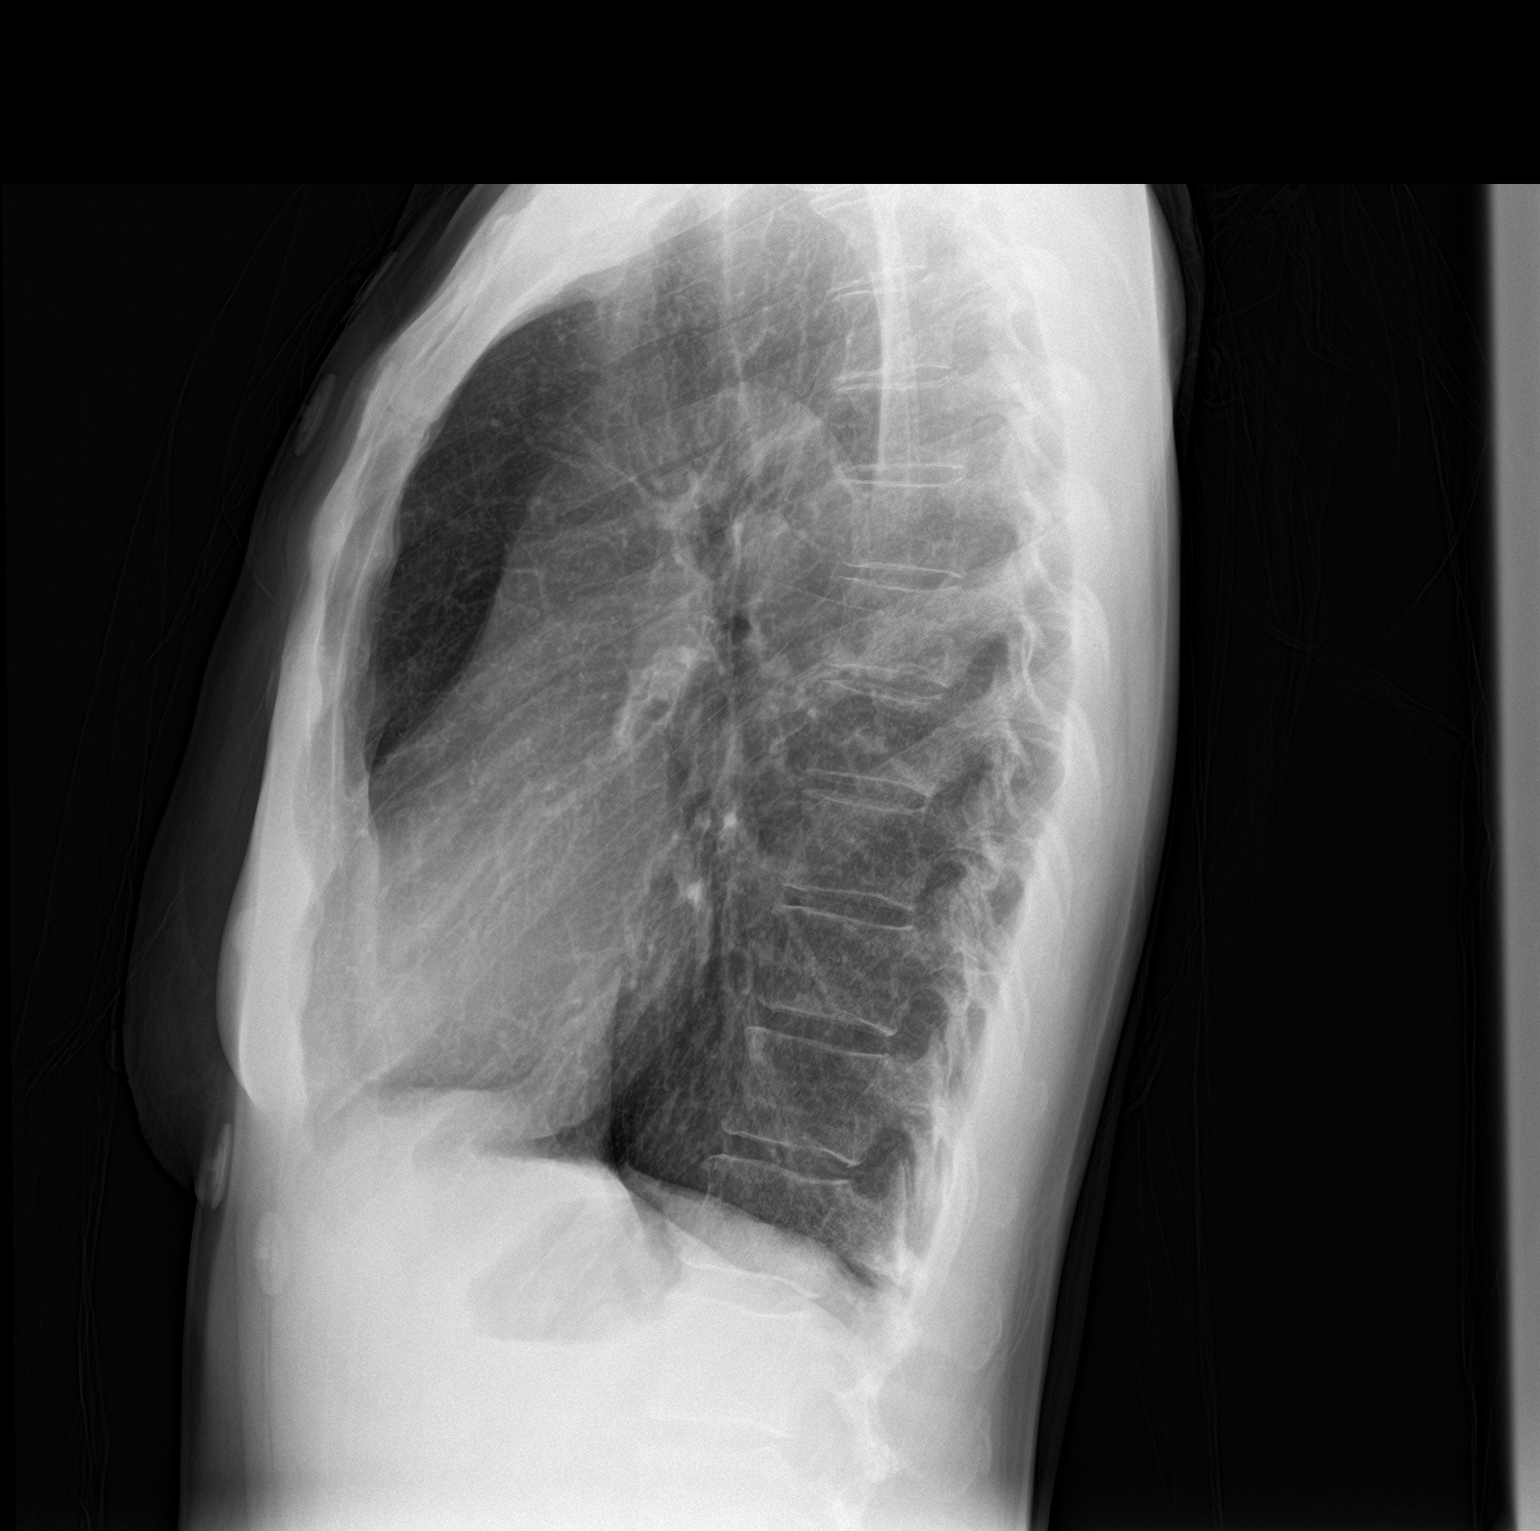

[2 of 2 positions shown; findings below may reference images not displayed]

FINDINGS: The lungs are hyperinflated. Normal heart size and mediastinal
contours. Mild aortic atherosclerosis. No focal airspace disease. No
pulmonary edema. No pleural fluid. No pneumothorax. Overlying
telemetry leads project over the chest. No acute osseous
abnormalities are seen.
IMPRESSION: Hyperinflation can be seen with bronchitis or asthma.

## 2023-02-16 ENCOUNTER — Ambulatory Visit
Admit: 2023-02-16 | Discharge: 2023-02-16 | Payer: BLUE CROSS/BLUE SHIELD | Attending: Physician Assistant | Primary: Physician Assistant

## 2023-02-16 DIAGNOSIS — I951 Orthostatic hypotension: Secondary | ICD-10-CM

## 2023-02-16 MED ORDER — MIDODRINE HCL 5 MG PO TABS
5 | ORAL_TABLET | Freq: Every day | ORAL | 1 refills | 30.00000 days | Status: DC
Start: 2023-02-16 — End: 2024-02-02

## 2023-02-16 NOTE — Progress Notes (Signed)
CHIEF COMPLAINT:  Chief Complaint   Patient presents with    New Patient     Establishing care   Recurring sinus infections, most recently about a month ago with back to back sinus infections   Has been referred to ENT already but has not been able to go         HISTORY OF PRESENT ILLNESS:  Catherine Bryant is a 52 y.o. female  who presents for establishing care.     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 It is stable and currently treated with Midodrine. She needs a refill sent to the pharmacy. She denies any headaches, dizziness, chest pain, or sob.     She has been fighting a virus that turned into a sinus infection for 1-2 weeks now. She has already treated with Augmentin, Prednisone, Astelin, Zyrtec, and is now on Doxycyline. She still feels sinus pressure and ear fullness and needs to see ENT.     Menopause and history of cervix removal. She would like hormone levels checked with labs.     She has not had fasting labs in quite some time. She would like those orders placed.     She is due for mammogram. She may also be due for colonoscopy, however, polyp was found on her last one and she had to pay $900 out of pocket. She needs to check with new insurance on this before ordering. She denies any abdominal pain or blood in stools.     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 She does not need GI referral at this time.     PHQ:      02/16/2023     8:01 AM   PHQ-9    Little interest or pleasure in doing things 1   Feeling down, depressed, or hopeless 0   Trouble falling or staying asleep, or sleeping too much 1   Feeling tired or having little energy 1   Poor appetite or overeating 0   Feeling bad about yourself - or that you are a failure or have let yourself or your family down 0   Trouble concentrating on things, such as reading the newspaper or watching television 1   Moving or  speaking so slowly that other people could have noticed. Or the opposite - being so fidgety or restless that you have been moving around a lot more than usual 0   Thoughts that you would be better off dead, or of hurting yourself in some way 0   PHQ-2 Score 1   PHQ-9 Total Score 4   If you checked off any problems, how difficult have these problems made it for you to do your work, take care of things at home, or get along with other people? 0       CURRENT MEDICATION LIST:    Current Outpatient Medications   Medication Sig Dispense Refill    doxycycline hyclate (VIBRA-TABS) 100 MG tablet Take 1 tablet by mouth 2 times daily      midodrine (PROAMATINE) 5 MG tablet Take 1 tablet by mouth daily 90 tablet 1    Azelastine HCl 137 MCG/SPRAY SOLN 1 spray by Nasal route 2 times daily      albuterol sulfate HFA (PROVENTIL;VENTOLIN;PROAIR) 108 (90 Base) MCG/ACT inhaler Inhale 2 puffs into the lungs every 6 hours as needed      cetirizine (ZYRTEC) 10  MG tablet Take 1 tablet by mouth daily      vitamin D (CHOLECALCIFEROL) 25 MCG (1000 UT) TABS tablet Take 1 tablet by mouth daily      traZODone (DESYREL) 50 MG tablet Take 1 tablet by mouth nightly      fluticasone (FLONASE) 50 MCG/ACT nasal spray 2 sprays by Nasal route daily      ibuprofen (ADVIL;MOTRIN) 200 MG tablet Take 1 tablet by mouth every 6 hours as needed       No current facility-administered medications for this visit.        ALLERGIES:    Allergies   Allergen Reactions    Cocamidopropyl Betaine Other (See Comments)     Other reaction(s): Other (See Comments)   Positive patch test   Other reaction(s): Other (See Comments)   Other reaction(s): Other (See Comments)   Positive patch test   Positive patch test   Other reaction(s): Other (See Comments)   Positive patch test   Other reaction(s): Other (See Comments)   Other reaction(s): Other (See Comments)   Positive patch test   Positive patch test   Positive patch test   Positive patch test    Other reaction(s): Other  (See Comments)   Positive patch test    Other reaction(s): Other (See Comments)   Other reaction(s): Other (See Comments)   Positive patch test   Positive patch test    Positive patch test    Other reaction(s): Other (See Comments)   Positive patch test   Other reaction(s): Other (See Comments)   Other reaction(s): Other (See Comments)   Positive patch test   Positive patch test   Positive patch test    Compazine [Prochlorperazine] Other (See Comments)     agitation    Formaldehyde Other (See Comments)     Other reaction(s): Other (See Comments)   Positive patch test   Other reaction(s): Other (See Comments)   Other reaction(s): Other (See Comments)   Positive patch test   Positive patch test   Other reaction(s): Other (See Comments)   Positive patch test   Other reaction(s): Other (See Comments)   Other reaction(s): Other (See Comments)   Positive patch test   Positive patch test   Positive patch test   Positive patch test    Other reaction(s): Other (See Comments)   Positive patch test    Other reaction(s): Other (See Comments)   Other reaction(s): Other (See Comments)   Positive patch test   Positive patch test    Positive patch test    Other reaction(s): Other (See Comments)   Positive patch test   Other reaction(s): Other (See Comments)   Other reaction(s): Other (See Comments)   Positive patch test   Positive patch test   Positive patch test    Urea Other (See Comments)     Other reaction(s): Other (See Comments)   Positive patch test   Other reaction(s): Other (See Comments)   Other reaction(s): Other (See Comments)   Positive patch test   Positive patch test   Other reaction(s): Other (See Comments)   Positive patch test   Other reaction(s): Other (See Comments)   Other reaction(s): Other (See Comments)   Positive patch test   Positive patch test   Positive patch test   Positive patch test    Other reaction(s): Other (See Comments)   Positive patch test    Other reaction(s): Other (See Comments)   Other  reaction(s): Other (See Comments)  Positive patch test   Positive patch test    Positive patch test    Other reaction(s): Other (See Comments)   Positive patch test   Other reaction(s): Other (See Comments)   Other reaction(s): Other (See Comments)   Positive patch test   Positive patch test   Positive patch test    Zolpidem Other (See Comments)     Sleep Walking   Other reaction(s): Other (See Comments), Other (See Comments)   Sleep Walking   Sleep Walking   Other reaction(s): Other (See Comments)   Sleep Walking   Other reaction(s): Other (See Comments), Other (See Comments)   Sleep Walking   Sleep Walking   Sleep Walking   Sleep Walking    Sleep Walking    Other reaction(s): Other (See Comments), Other (See Comments)   Sleep Walking   Sleep Walking    Other reaction(s): Other (See Comments)   Sleep Walking   Other reaction(s): Other (See Comments), Other (See Comments)   Sleep Walking   Sleep Walking   Sleep Walking    Atomoxetine Palpitations    Chlorhexidine Rash     allergic contact dermatitis   allergic contact dermatitis   allergic contact dermatitis   allergic contact dermatitis   allergic contact dermatitis   allergic contact dermatitis   allergic contact dermatitis   allergic contact dermatitis    allergic contact dermatitis    allergic contact dermatitis   allergic contact dermatitis    allergic contact dermatitis   allergic contact dermatitis   allergic contact dermatitis   allergic contact dermatitis        HISTORY:  Past Medical History:   Diagnosis Date    Allergic rhinitis     Anxiety     Headache     Hypotension       Past Surgical History:   Procedure Laterality Date    HYSTERECTOMY, VAGINAL      KNEE ARTHROPLASTY        Social History     Socioeconomic History    Marital status: Single     Spouse name: Not on file    Number of children: Not on file    Years of education: Not on file    Highest education level: Not on file   Occupational History    Not on file   Tobacco Use    Smoking status:  Never    Smokeless tobacco: Never   Substance and Sexual Activity    Alcohol use: Not Currently    Drug use: Never    Sexual activity: Not on file   Other Topics Concern    Not on file   Social History Narrative    Not on file     Social Determinants of Health     Financial Resource Strain: Not on file   Food Insecurity: Not on file   Transportation Needs: Not on file   Physical Activity: Not on file   Stress: Not on file   Social Connections: Not on file   Intimate Partner Violence: Not on file   Housing Stability: Not on file      Family History   Problem Relation Age of Onset    Transient ischemic attack  Mother     Cancer Mother     Neurofibromatosis Father     Cardiomyopathy Brother         REVIEW OF SYSTEMS:  Review of systems is as indicated in HPI otherwise negative.    PHYSICAL  EXAM:  Physical Exam  Vitals reviewed.   Constitutional:       General: She is not in acute distress.     Appearance: Normal appearance. She is normal weight. She is not ill-appearing, toxic-appearing or diaphoretic.   HENT:      Head: Normocephalic and atraumatic.      Right Ear: Tympanic membrane normal. There is no impacted cerumen.      Left Ear: Tympanic membrane normal. There is no impacted cerumen.      Ears:      Comments: Right ear with fullness, no injection or swelling     Nose: Congestion present.      Mouth/Throat:      Pharynx: Oropharynx is clear. Posterior oropharyngeal erythema present. No oropharyngeal exudate.   Eyes:      General: No scleral icterus.        Right eye: No discharge.         Left eye: No discharge.      Extraocular Movements: Extraocular movements intact.      Conjunctiva/sclera: Conjunctivae normal.      Pupils: Pupils are equal, round, and reactive to light.   Cardiovascular:      Rate and Rhythm: Normal rate and regular rhythm.      Pulses: Normal pulses.      Heart sounds: Normal heart sounds. No murmur heard.  Pulmonary:      Effort: Pulmonary effort is normal. No respiratory distress.       Breath sounds: Normal breath sounds. No wheezing, rhonchi or rales.   Abdominal:      General: Abdomen is flat. Bowel sounds are normal. There is no distension.      Palpations: Abdomen is soft.      Tenderness: There is no abdominal tenderness.   Musculoskeletal:         General: No swelling or signs of injury. Normal range of motion.      Cervical back: Normal range of motion and neck supple. No rigidity or tenderness.   Lymphadenopathy:      Cervical: No cervical adenopathy.   Skin:     General: Skin is warm and dry.      Findings: No rash.   Neurological:      General: No focal deficit present.      Mental Status: She is alert and oriented to person, place, and time.      Sensory: No sensory deficit.      Motor: No weakness.      Coordination: Coordination normal.      Gait: Gait normal.   Psychiatric:         Mood and Affect: Mood normal.         Behavior: Behavior normal.         Thought Content: Thought content normal.         Judgment: Judgment normal.        Vital Signs -   Visit Vitals  BP 102/62   Pulse 68   Ht 1.702 m (5\' 7" )   Wt 69.1 kg (152 lb 4.8 oz)   SpO2 99%   BMI 23.85 kg/m      LABS  No results found for this visit on 02/16/23.  No results found for any previous visit.       IMPRESSION/PLAN    Encounter Diagnoses   Name Primary?    Orthostatic hypotension Yes    Non-seasonal allergic rhinitis due to other allergic trigger  Menopause     Acute recurrent maxillary sinusitis     Screening, lipid     Encounter for screening mammogram for malignant neoplasm of breast      1. Orthostatic hypotension  Overview:   Echo  01/11/08 with EF > 55%. Mild MR    24 hour holter monitor 04/03/13. Average HR 69 (min 47, max 147); 530 VE beats; Patient marker with NSR, sinus tachycardia and VE.    Orders:  -     midodrine (PROAMATINE) 5 MG tablet; Take 1 tablet by mouth daily, Disp-90 tablet, R-1Normal    -     CBC with Auto Differential; Future  -     Comprehensive Metabolic Panel; Future  -     TSH with Reflex;  Future    2. Non-seasonal allergic rhinitis due to other allergic trigger  [facility]NT, [facility] Allergy    -     CBC with Auto Differential; Future    3. Menopause  -     Estrogens, total; Future  -     Progesterone; Future  -     Testosterone; Future    4. Acute recurrent maxillary sinusitis  [facility] ENT, and Allergy    -     CBC with Auto Differential; Future    Currently just finished Prednisone and Augmentin. Now on Doxycyline with Zyrtec and Astelin.     5. Screening, lipid  -     Lipid Panel; Future    6. Encounter for screening mammogram for malignant neoplasm of breast  -     MAM TOMO DIGITAL SCREEN BILATERAL (PER PROTOCOL); Future     Follow up and Dispositions:  Return in about 6 months (around 08/19/2023).       Doreene Burke, [place]

## 2023-03-17 NOTE — Telephone Encounter (Signed)
Ent Update:    Per [facility]> The scheduling coordinator contacted the patient to schedule on 02/17/23. The patient decline to schedule when called. She said that she will call back. The patient has not scheduled at this time.      Referral Specialist  Norris Cross

## 2023-08-24 ENCOUNTER — Encounter: Admit: 2023-08-24 | Payer: BLUE CROSS/BLUE SHIELD | Admitting: Physician Assistant | Primary: Physician Assistant

## 2023-08-24 ENCOUNTER — Encounter: Admit: 2023-08-24 | Admitting: Physician Assistant

## 2023-08-24 VITALS — BP 106/70 | HR 54 | Ht 67.0 in | Wt 140.2 lb

## 2023-08-24 DIAGNOSIS — Z780 Asymptomatic menopausal state: Secondary | ICD-10-CM

## 2023-08-24 DIAGNOSIS — J069 Acute upper respiratory infection, unspecified: Principal | ICD-10-CM

## 2023-08-24 LAB — AMB POC COVID-19 & INFLUENZA A/B
INFLUENZA A RNA, POC: NOT DETECTED
INFLUENZA B RNA, POC: NOT DETECTED
SARS-COV-2 RNA, POC: NEGATIVE

## 2023-08-24 LAB — CBC WITH AUTO DIFFERENTIAL
Basophils %: 0.9 % (ref 0.0–2.0)
Basophils Absolute: 0 10*3/uL (ref 0.0–0.2)
Eosinophils %: 1.2 % (ref 0.0–7.0)
Eosinophils Absolute: 0.1 10*3/uL (ref 0.0–0.5)
Hematocrit: 37.8 % (ref 34.0–47.0)
Hemoglobin: 12.9 g/dL (ref 11.5–15.7)
Immature Grans (Abs): 0.01 10*3/uL (ref 0.00–0.06)
Immature Granulocytes %: 0.2 % (ref 0.0–0.6)
Lymphocytes Absolute: 1.9 10*3/uL (ref 1.0–3.2)
Lymphocytes: 44.3 % (ref 15.0–45.0)
MCH: 30.6 pg (ref 27.0–34.5)
MCHC: 34.1 g/dL (ref 30.0–36.0)
MCV: 89.8 fL (ref 81.0–99.0)
MPV: 10.4 fL (ref 7.0–12.2)
Monocytes %: 5.4 % (ref 4.0–12.0)
Monocytes Absolute: 0.2 10*3/uL — ABNORMAL LOW (ref 0.3–1.0)
NRBC Absolute: 0 10*3/uL (ref 0.000–0.012)
NRBC Automated: 0 % (ref 0.0–0.2)
Neutrophils %: 48 % (ref 42.0–74.0)
Neutrophils Absolute: 2.1 10*3/uL (ref 1.6–7.3)
Platelets: 232 10*3/uL (ref 140–440)
RBC: 4.21 x10e6/mcL (ref 3.60–5.20)
RDW: 12.8 % (ref 10.0–17.0)
WBC: 4.3 10*3/uL (ref 3.8–10.6)

## 2023-08-24 MED ORDER — FLUCONAZOLE 150 MG PO TABS
150 | ORAL_TABLET | Freq: Once | ORAL | 0 refills | Status: AC
Start: 2023-08-24 — End: 2023-08-24

## 2023-08-24 MED ORDER — ALBUTEROL SULFATE HFA 108 (90 BASE) MCG/ACT IN AERS
108 | Freq: Four times a day (QID) | RESPIRATORY_TRACT | 0 refills | Status: AC | PRN
Start: 2023-08-24 — End: ?

## 2023-08-24 NOTE — Progress Notes (Signed)
 CHIEF COMPLAINT:  Chief Complaint   Patient presents with    6 Month Follow-Up        HISTORY OF PRESENT ILLNESS:  Catherine Bryant is a 52 y.o. female  who presents for 6 month f/u but she has not been able to do labs or mammogram. She is sick, so will make [place]

## 2023-08-25 ENCOUNTER — Encounter: Admit: 2023-08-25 | Admitting: Physician Assistant

## 2023-08-25 DIAGNOSIS — G43009 Migraine without aura, not intractable, without status migrainosus: Secondary | ICD-10-CM

## 2023-08-25 LAB — COMPREHENSIVE METABOLIC PANEL
ALT: 19 U/L (ref 0–42)
AST: 25 U/L (ref 0–46)
Albumin/Globulin Ratio: 2.4 (ref 1.00–2.70)
Albumin: 4.6 g/dL (ref 3.5–5.2)
Alk Phosphatase: 77 U/L (ref 35–117)
Anion Gap: 15 mmol/L (ref 2–17)
BUN: 12 mg/dL (ref 6–20)
CO2: 21 mmol/L — ABNORMAL LOW (ref 22–29)
Calcium: 10 mg/dL (ref 8.5–10.7)
Chloride: 103 mmol/L (ref 98–107)
Creatinine: 0.9 mg/dL (ref 0.5–1.0)
Est, Glom Filt Rate: 76 mL/min/1.73mÂ² (ref >=60)
Globulin: 1.9 g/dL (ref 1.9–4.4)
Glucose: 91 mg/dL (ref 70–99)
Osmolaliy Calculated: 277 mosm/kg (ref 270–287)
Potassium: 3.9 mmol/L (ref 3.5–5.3)
Sodium: 139 mmol/L (ref 135–145)
Total Bilirubin: 0.43 mg/dL (ref 0.00–1.20)
Total Protein: 6.5 g/dL (ref 5.7–8.3)

## 2023-08-25 LAB — TESTOSTERONE: Testosterone: <12 ng/dL (ref 2.9–40.8)

## 2023-08-25 LAB — LIPID PANEL
Chol/HDL Ratio: 2.3 (ref 0.0–4.4)
Cholesterol, Total: 201 mg/dL — ABNORMAL HIGH (ref 100–200)
HDL: 88 mg/dL (ref >=50)
LDL Cholesterol: 101.2 mg/dL — ABNORMAL HIGH (ref 0.0–100.0)
LDL/HDL Ratio: 1.2
Triglycerides: 59 mg/dL (ref 0–149)
VLDL: 11.8 mg/dL (ref 5.0–40.0)

## 2023-08-25 LAB — TSH WITH REFLEX: TSH: 3.91 u[IU]/mL — ABNORMAL HIGH (ref 0.358–3.740)

## 2023-08-25 LAB — T4, FREE: T4 Free: 1.15 ng/dL (ref 0.82–1.70)

## 2023-08-25 LAB — PROGESTERONE: Progesterone: <0.64 ng/mL

## 2023-08-25 MED ORDER — SUMATRIPTAN SUCCINATE 50 MG PO TABS
50 | ORAL_TABLET | Freq: Once | ORAL | 0 refills | Status: AC | PRN
Start: 2023-08-25 — End: 2023-08-25

## 2023-08-27 LAB — ESTROGENS, TOTAL: Estrogen Total: 41 pg/mL

## 2023-09-19 ENCOUNTER — Ambulatory Visit
Admit: 2023-09-19 | Discharge: 2023-09-19 | Payer: BLUE CROSS/BLUE SHIELD | Attending: Family | Admitting: Family | Primary: Physician Assistant

## 2023-09-19 VITALS — BP 112/70 | HR 101 | Temp 98.60000°F | Wt 139.0 lb

## 2023-09-19 DIAGNOSIS — J329 Chronic sinusitis, unspecified: Secondary | ICD-10-CM

## 2023-09-19 MED ORDER — PREDNISONE 20 MG PO TABS
20 | ORAL_TABLET | Freq: Every morning | ORAL | 0 refills | 6.00000 days | Status: AC
Start: 2023-09-19 — End: 2023-09-24

## 2023-09-19 MED ORDER — AMOXICILLIN-POT CLAVULANATE 875-125 MG PO TABS
875-125 | ORAL_TABLET | Freq: Two times a day (BID) | ORAL | 0 refills | 7.00000 days | Status: AC
Start: 2023-09-19 — End: 2023-09-29

## 2023-09-19 MED ORDER — GUAIFENESIN-CODEINE 100-10 MG/5ML PO SOLN
100-10 | Freq: Three times a day (TID) | ORAL | 0 refills | 4.50000 days | Status: AC | PRN
Start: 2023-09-19 — End: 2023-09-24

## 2023-09-19 MED ORDER — FLUCONAZOLE 150 MG PO TABS
150 | ORAL_TABLET | Freq: Once | ORAL | 0 refills | 6.00000 days | Status: AC
Start: 2023-09-19 — End: 2023-09-19

## 2023-09-19 NOTE — Progress Notes (Signed)
 HPI:    Catherine Bryant is a 51 y.o. female here today with Cough (Started 5 days ago, went to urgent care 4 days ago, tested neg covid/flu. Was given astelin and is still coughing .)  . Miss. Ring here today with c/o persistent dry cough, myalgias, sinus congestion, sore throat and hoarse voice for the past 5 days. She was seen 4 days ago at Urgent care with negative covid/flu and given Astelin without relief. Returned to urgent care yesterday and was told to wait it out. Fever 100-101 F Sat/Sun. Notes symptoms are mildly improved. Denies chills, SOB, wheezing. Notes coughing fits and cough interfering with sleep. She has been taking Flonase, Nyquil, Zyrtec, Ibuprofen and Albuterol  inhaler Q 4 hours with minimal relief. + Tooth pain, denies purulent nasal discharge.    Review of Systems   Constitutional:  Positive for fatigue.   HENT:  Positive for congestion.    Respiratory:  Positive for cough.    Musculoskeletal:  Positive for myalgias.   Neurological:  Positive for headaches.   All other systems reviewed and are negative.       Current Outpatient Medications   Medication Sig Dispense Refill    fluconazole  (DIFLUCAN ) 150 MG tablet Take 1 tablet by mouth once for 1 dose 1 tablet 0    amoxicillin-clavulanate (AUGMENTIN) 875-125 MG per tablet Take 1 tablet by mouth 2 times daily for 10 days 20 tablet 0    guaiFENesin-codeine (GUAIFENESIN AC) 100-10 MG/5ML liquid Take 10 mLs by mouth 3 times daily as needed for Cough for up to 5 days. Max Daily Amount: 30 mLs 150 mL 0    predniSONE (DELTASONE) 20 MG tablet Take 1 tablet by mouth every morning for 5 days 5 tablet 0    SUMAtriptan  (IMITREX ) 50 MG tablet Take 1 tablet by mouth once as needed for Migraine 9 tablet 0    ondansetron (ZOFRAN) 4 MG tablet Take by mouth      albuterol  sulfate HFA (VENTOLIN  HFA) 108 (90 Base) MCG/ACT inhaler Inhale 2 puffs into the lungs 4 times daily as needed for Wheezing 18 g 0    midodrine  (PROAMATINE ) 5 MG tablet Take 1 tablet by mouth  daily 90 tablet 1    Azelastine HCl 137 MCG/SPRAY SOLN 1 spray by Nasal route 2 times daily      albuterol  sulfate HFA (PROVENTIL ;VENTOLIN ;PROAIR ) 108 (90 Base) MCG/ACT inhaler Inhale 2 puffs into the lungs every 6 hours as needed      cetirizine (ZYRTEC) 10 MG tablet Take 1 tablet by mouth daily      vitamin D (CHOLECALCIFEROL) 25 MCG (1000 UT) TABS tablet Take 1 tablet by mouth daily      traZODone (DESYREL) 50 MG tablet Take 1 tablet by mouth nightly      fluticasone (FLONASE) 50 MCG/ACT nasal spray 2 sprays by Nasal route daily      ibuprofen (ADVIL;MOTRIN) 200 MG tablet Take 1 tablet by mouth every 6 hours as needed       No current facility-administered medications for this visit.        Allergies   Allergen Reactions    Prochlorperazine Other (See Comments) and Anaphylaxis     agitation    Cocamidopropyl Betaine Other (See Comments)     Other reaction(s): Other (See Comments)   Positive patch test   Other reaction(s): Other (See Comments)   Other reaction(s): Other (See Comments)   Positive patch test   Positive patch test   Other reaction(s):  Other (See Comments)   Positive patch test   Other reaction(s): Other (See Comments)   Other reaction(s): Other (See Comments)   Positive patch test   Positive patch test   Positive patch test   Positive patch test    Other reaction(s): Other (See Comments)   Positive patch test    Other reaction(s): Other (See Comments)   Other reaction(s): Other (See Comments)   Positive patch test   Positive patch test    Positive patch test    Other reaction(s): Other (See Comments)   Positive patch test   Other reaction(s): Other (See Comments)   Other reaction(s): Other (See Comments)   Positive patch test   Positive patch test   Positive patch test    Other reaction(s): Other (See Comments) Positive patch test      Other reaction(s): Other (See Comments) Other reaction(s): Other (See Comments) Positive patch test Positive patch test      Other reaction(s): Other (See Comments)  Positive patch test Other reaction(s): Other (See Comments) Other reaction(s): Other (See Comments) Positive patch test Positive patch test Other reaction(s): Other (See Comments) Positive patch test Other reaction(s): Other (See Comments) Other reaction(s): Other (See Comments) Positive patch test Positive patch test Positive patch test Positive patch test      Other reaction(s): Other (See Comments) Positive patch test Other reaction(s): Other (See Comments) Other reaction(s): Other (See Comments) Positive patch test Positive patch test Positive patch test      Positive patch test      Other reaction(s): Other (See Comments) Positive patch test Other reaction(s): Other (See Comments) Other reaction(s): Other (See Comments) Positive patch test Positive patch test Other reaction(s): Other (See Comments) Positive patch test Other reaction(s): Other (See Comments) Other reaction(s): Other (See Comments) Positive patch test Positive patch test Positive patch test Positive patch test  Other reaction(s): Other (See Comments) Positive patch test  Other reaction(s): Other (See Comments) Other reaction(s): Other (See Comments) Positive patch test Positive patch test  Positive patch test  Other reaction(s): Other (See Comments) Positive patch test Other reaction(s): Other (See Comments) Other reaction(s): Other (See Comments) Positive patch test Positive patch test Positive patch test    Formaldehyde Other (See Comments)     Other reaction(s): Other (See Comments)   Positive patch test   Other reaction(s): Other (See Comments)   Other reaction(s): Other (See Comments)   Positive patch test   Positive patch test   Other reaction(s): Other (See Comments)   Positive patch test   Other reaction(s): Other (See Comments)   Other reaction(s): Other (See Comments)   Positive patch test   Positive patch test   Positive patch test   Positive patch test    Other reaction(s): Other (See Comments)   Positive patch test    Other  reaction(s): Other (See Comments)   Other reaction(s): Other (See Comments)   Positive patch test   Positive patch test    Positive patch test    Other reaction(s): Other (See Comments)   Positive patch test   Other reaction(s): Other (See Comments)   Other reaction(s): Other (See Comments)   Positive patch test   Positive patch test   Positive patch test    Other reaction(s): Other (See Comments) Positive patch test      Other reaction(s): Other (See Comments) Other reaction(s): Other (See Comments) Positive patch test Positive patch test      Other reaction(s): Other (See Comments) Positive patch test  Other reaction(s): Other (See Comments) Other reaction(s): Other (See Comments) Positive patch test Positive patch test Other reaction(s): Other (See Comments) Positive patch test Other reaction(s): Other (See Comments) Other reaction(s): Other (See Comments) Positive patch test Positive patch test Positive patch test Positive patch test      Other reaction(s): Other (See Comments) Positive patch test Other reaction(s): Other (See Comments) Other reaction(s): Other (See Comments) Positive patch test Positive patch test Positive patch test      Positive patch test      Other reaction(s): Other (See Comments) Positive patch test Other reaction(s): Other (See Comments) Other reaction(s): Other (See Comments) Positive patch test Positive patch test Other reaction(s): Other (See Comments) Positive patch test Other reaction(s): Other (See Comments) Other reaction(s): Other (See Comments) Positive patch test Positive patch test Positive patch test Positive patch test  Other reaction(s): Other (See Comments) Positive patch test  Other reaction(s): Other (See Comments) Other reaction(s): Other (See Comments) Positive patch test Positive patch test  Positive patch test  Other reaction(s): Other (See Comments) Positive patch test Other reaction(s): Other (See Comments) Other reaction(s): Other (See Comments) Positive patch test  Positive patch test Positive patch test    Other Other (See Comments)     Other reaction(s): Other (See Comments) Para-phenylenediamine = Positive patch test amidoamine = Positive patch test      Other reaction(s): Other (See Comments) Para-phenylenediamine = Positive patch test amidoamine = Positive patch test Para-phenylenediamine = Positive patch test amidoamine = Positive patch test      Para-phenylenediamine = Positive patch test amidoamine = Positive patch test    Urea Other (See Comments)     Other reaction(s): Other (See Comments)   Positive patch test   Other reaction(s): Other (See Comments)   Other reaction(s): Other (See Comments)   Positive patch test   Positive patch test   Other reaction(s): Other (See Comments)   Positive patch test   Other reaction(s): Other (See Comments)   Other reaction(s): Other (See Comments)   Positive patch test   Positive patch test   Positive patch test   Positive patch test    Other reaction(s): Other (See Comments)   Positive patch test    Other reaction(s): Other (See Comments)   Other reaction(s): Other (See Comments)   Positive patch test   Positive patch test    Positive patch test    Other reaction(s): Other (See Comments)   Positive patch test   Other reaction(s): Other (See Comments)   Other reaction(s): Other (See Comments)   Positive patch test   Positive patch test   Positive patch test    Other reaction(s): Other (See Comments) Positive patch test      Other reaction(s): Other (See Comments) Other reaction(s): Other (See Comments) Positive patch test Positive patch test      Positive patch test    Other reaction(s): Other (See Comments) Positive patch test Other reaction(s): Other (See Comments) Other reaction(s): Other (See Comments) Positive patch test Positive patch test Other reaction(s): Other (See Comments) Positive patch test Other reaction(s): Other (See Comments) Other reaction(s): Other (See Comments) Positive patch test Positive patch test Positive  patch test Positive patch test      Other reaction(s): Other (See Comments) Positive patch test Other reaction(s): Other (See Comments) Other reaction(s): Other (See Comments) Positive patch test Positive patch test Positive patch test      Other reaction(s): Other (See Comments) Positive patch test Other reaction(s): Other (See Comments)  Other reaction(s): Other (See Comments) Positive patch test Positive patch test Other reaction(s): Other (See Comments) Positive patch test Other reaction(s): Other (See Comments) Other reaction(s): Other (See Comments) Positive patch test Positive patch test Positive patch test Positive patch test  Other reaction(s): Other (See Comments) Positive patch test  Other reaction(s): Other (See Comments) Other reaction(s): Other (See Comments) Positive patch test Positive patch test  Positive patch test  Other reaction(s): Other (See Comments) Positive patch test Other reaction(s): Other (See Comments) Other reaction(s): Other (See Comments) Positive patch test Positive patch test Positive patch test    Zolpidem Other (See Comments)     Sleep Walking   Other reaction(s): Other (See Comments), Other (See Comments)   Sleep Walking   Sleep Walking   Other reaction(s): Other (See Comments)   Sleep Walking   Other reaction(s): Other (See Comments), Other (See Comments)   Sleep Walking   Sleep Walking   Sleep Walking   Sleep Walking    Sleep Walking    Other reaction(s): Other (See Comments), Other (See Comments)   Sleep Walking   Sleep Walking    Other reaction(s): Other (See Comments)   Sleep Walking   Other reaction(s): Other (See Comments), Other (See Comments)   Sleep Walking   Sleep Walking   Sleep Walking    Sleep walking    Atomoxetine Palpitations    Betaine Rash    Chlorhexidine Rash     allergic contact dermatitis   allergic contact dermatitis   allergic contact dermatitis   allergic contact dermatitis   allergic contact dermatitis   allergic contact dermatitis   allergic contact  dermatitis   allergic contact dermatitis    allergic contact dermatitis    allergic contact dermatitis   allergic contact dermatitis    allergic contact dermatitis   allergic contact dermatitis   allergic contact dermatitis   allergic contact dermatitis        Past Medical History:   Diagnosis Date    Allergic rhinitis     Anxiety     Headache     Hypotension         Past Surgical History:   Procedure Laterality Date    HYSTERECTOMY, VAGINAL      KNEE ARTHROPLASTY          Family History   Problem Relation Age of Onset    Transient ischemic attack  Mother     Cancer Mother     Neurofibromatosis Father     Cardiomyopathy Brother         Social History     Socioeconomic History    Marital status: Single     Spouse name: Not on file    Number of children: Not on file    Years of education: Not on file    Highest education level: Not on file   Occupational History    Not on file   Tobacco Use    Smoking status: Never    Smokeless tobacco: Never   Vaping Use    Vaping status: Never Used   Substance and Sexual Activity    Alcohol use: Not Currently    Drug use: Never    Sexual activity: Not Currently   Other Topics Concern    Not on file   Social History Narrative    Not on file     Social Determinants of Health     Financial Resource Strain: Not on file   Food Insecurity: Not on file  Transportation Needs: Not on file   Physical Activity: Not on file   Stress: Not on file   Social Connections: Not on file   Intimate Partner Violence: Not on file   Housing Stability: Not on file        BP 112/70   Pulse (!) 101   Temp 98.6 F (37 C)   Wt 63 kg (139 lb)   SpO2 95%   BMI 21.77 kg/m      Physical Exam  Vitals and nursing note reviewed.   Constitutional:       General: She is not in acute distress.     Appearance: Normal appearance. She is normal weight. She is not ill-appearing or toxic-appearing.   HENT:      Head: Normocephalic and atraumatic.      Right Ear: Tympanic membrane, ear canal and external ear normal.       Left Ear: Tympanic membrane, ear canal and external ear normal.      Mouth/Throat:      Pharynx: Posterior oropharyngeal erythema present.      Comments: + cobblestoning  Cardiovascular:      Rate and Rhythm: Normal rate and regular rhythm.      Pulses: Normal pulses.      Heart sounds: Normal heart sounds.   Pulmonary:      Effort: Pulmonary effort is normal.      Breath sounds: Normal breath sounds.   Lymphadenopathy:      Cervical: No cervical adenopathy.   Skin:     General: Skin is warm and dry.      Capillary Refill: Capillary refill takes less than 2 seconds.   Neurological:      Mental Status: She is alert.          Elaiza was seen today for cough.    Diagnoses and all orders for this visit:    Rhinosinusitis  -     guaiFENesin-codeine (GUAIFENESIN AC) 100-10 MG/5ML liquid; Take 10 mLs by mouth 3 times daily as needed for Cough for up to 5 days. Max Daily Amount: 30 mLs    Other orders  -     fluconazole  (DIFLUCAN ) 150 MG tablet; Take 1 tablet by mouth once for 1 dose  -     amoxicillin-clavulanate (AUGMENTIN) 875-125 MG per tablet; Take 1 tablet by mouth 2 times daily for 10 days  -     predniSONE (DELTASONE) 20 MG tablet; Take 1 tablet by mouth every morning for 5 days       Reviewed viral nature of sinus infection and symptomatic treatment with rest, adequate hydration, Claritin for PND, Flonase for PND and sinus congestion, warm steam, Prednisone as directed Q AM x 5 days for sinus pain, Tylenol/Motrin PRN. If symptoms worsen to include fever> 1105f or SOB RTC for F/U. Cheratussin PRN cough with caution, Augmentin as directed if unresolved or worsening symptoms by end of week. Diflucan  PRN with antibiotic use.     Work Note to return 1/3    Return if symptoms worsen or fail to improve.           Darcus Eastern, APRN - NP

## 2023-09-19 NOTE — Patient Instructions (Addendum)
 Reviewed viral nature of sinus infection and symptomatic treatment with rest, adequate hydration, Claritin for PND, Flonase for PND and sinus congestion, warm steam, Prednisone as directed Q AM x 5 days for sinus pain, Tylenol/Motrin PRN. If symptoms worsen to include fever> 124f or SOB RTC for F/U. Cheratussin PRN cough with caution, Augmentin as directed if unresolved or worsening symptoms by end of week. Diflucan  PRN with antibiotic use.     Work Note to return 1/3

## 2023-10-25 ENCOUNTER — Ambulatory Visit
Admit: 2023-10-26 | Discharge: 2023-10-26 | Payer: BLUE CROSS/BLUE SHIELD | Attending: Family | Primary: Physician Assistant

## 2023-10-25 DIAGNOSIS — N898 Other specified noninflammatory disorders of vagina: Secondary | ICD-10-CM

## 2023-10-25 NOTE — Progress Notes (Signed)
HPI:    Catherine Bryant is a 53 y.o. female here today with Other (Vaginal discharge (green),lower abdominal discomfort x yesterday)  . Catherine Bryant here today with c/o green vaginal discharge and pelvic pain since yesterday. Denies dysuria, hematuria, flank pain, increased urinary frequency, vaginal itching, Hx STI. Denies new sexual partners. Admits to wearing sweaty yoga pants and soap change recently.    Review of Systems   Genitourinary:  Positive for vaginal discharge.   All other systems reviewed and are negative.       Current Outpatient Medications   Medication Sig Dispense Refill    SUMAtriptan (IMITREX) 50 MG tablet Take 1 tablet by mouth once as needed for Migraine 9 tablet 0    ondansetron (ZOFRAN) 4 MG tablet Take by mouth      albuterol sulfate HFA (VENTOLIN HFA) 108 (90 Base) MCG/ACT inhaler Inhale 2 puffs into the lungs 4 times daily as needed for Wheezing 18 g 0    midodrine (PROAMATINE) 5 MG tablet Take 1 tablet by mouth daily 90 tablet 1    Azelastine HCl 137 MCG/SPRAY SOLN 1 spray by Nasal route 2 times daily      albuterol sulfate HFA (PROVENTIL;VENTOLIN;PROAIR) 108 (90 Base) MCG/ACT inhaler Inhale 2 puffs into the lungs every 6 hours as needed      cetirizine (ZYRTEC) 10 MG tablet Take 1 tablet by mouth daily      vitamin D (CHOLECALCIFEROL) 25 MCG (1000 UT) TABS tablet Take 1 tablet by mouth daily      traZODone (DESYREL) 50 MG tablet Take 1 tablet by mouth nightly      fluticasone (FLONASE) 50 MCG/ACT nasal spray 2 sprays by Nasal route daily      ibuprofen (ADVIL;MOTRIN) 200 MG tablet Take 1 tablet by mouth every 6 hours as needed       No current facility-administered medications for this visit.        Allergies   Allergen Reactions    Prochlorperazine Other (See Comments) and Anaphylaxis     agitation    Cocamidopropyl Betaine Other (See Comments)     Other reaction(s): Other (See Comments)   Positive patch test   Other reaction(s): Other (See Comments)   Other reaction(s): Other (See  Comments)   Positive patch test   Positive patch test   Other reaction(s): Other (See Comments)   Positive patch test   Other reaction(s): Other (See Comments)   Other reaction(s): Other (See Comments)   Positive patch test   Positive patch test   Positive patch test   Positive patch test    Other reaction(s): Other (See Comments)   Positive patch test    Other reaction(s): Other (See Comments)   Other reaction(s): Other (See Comments)   Positive patch test   Positive patch test    Positive patch test    Other reaction(s): Other (See Comments)   Positive patch test   Other reaction(s): Other (See Comments)   Other reaction(s): Other (See Comments)   Positive patch test   Positive patch test   Positive patch test    Other reaction(s): Other (See Comments) Positive patch test      Other reaction(s): Other (See Comments) Other reaction(s): Other (See Comments) Positive patch test Positive patch test      Other reaction(s): Other (See Comments) Positive patch test Other reaction(s): Other (See Comments) Other reaction(s): Other (See Comments) Positive patch test Positive patch test Other reaction(s): Other (See Comments) Positive patch test Other  reaction(s): Other (See Comments) Other reaction(s): Other (See Comments) Positive patch test Positive patch test Positive patch test Positive patch test      Other reaction(s): Other (See Comments) Positive patch test Other reaction(s): Other (See Comments) Other reaction(s): Other (See Comments) Positive patch test Positive patch test Positive patch test      Positive patch test      Other reaction(s): Other (See Comments) Positive patch test Other reaction(s): Other (See Comments) Other reaction(s): Other (See Comments) Positive patch test Positive patch test Other reaction(s): Other (See Comments) Positive patch test Other reaction(s): Other (See Comments) Other reaction(s): Other (See Comments) Positive patch test Positive patch test Positive patch test Positive patch test   Other reaction(s): Other (See Comments) Positive patch test  Other reaction(s): Other (See Comments) Other reaction(s): Other (See Comments) Positive patch test Positive patch test  Positive patch test  Other reaction(s): Other (See Comments) Positive patch test Other reaction(s): Other (See Comments) Other reaction(s): Other (See Comments) Positive patch test Positive patch test Positive patch test    Formaldehyde Other (See Comments)     Other reaction(s): Other (See Comments)   Positive patch test   Other reaction(s): Other (See Comments)   Other reaction(s): Other (See Comments)   Positive patch test   Positive patch test   Other reaction(s): Other (See Comments)   Positive patch test   Other reaction(s): Other (See Comments)   Other reaction(s): Other (See Comments)   Positive patch test   Positive patch test   Positive patch test   Positive patch test    Other reaction(s): Other (See Comments)   Positive patch test    Other reaction(s): Other (See Comments)   Other reaction(s): Other (See Comments)   Positive patch test   Positive patch test    Positive patch test    Other reaction(s): Other (See Comments)   Positive patch test   Other reaction(s): Other (See Comments)   Other reaction(s): Other (See Comments)   Positive patch test   Positive patch test   Positive patch test    Other reaction(s): Other (See Comments) Positive patch test      Other reaction(s): Other (See Comments) Other reaction(s): Other (See Comments) Positive patch test Positive patch test      Other reaction(s): Other (See Comments) Positive patch test Other reaction(s): Other (See Comments) Other reaction(s): Other (See Comments) Positive patch test Positive patch test Other reaction(s): Other (See Comments) Positive patch test Other reaction(s): Other (See Comments) Other reaction(s): Other (See Comments) Positive patch test Positive patch test Positive patch test Positive patch test      Other reaction(s): Other (See Comments) Positive  patch test Other reaction(s): Other (See Comments) Other reaction(s): Other (See Comments) Positive patch test Positive patch test Positive patch test      Positive patch test      Other reaction(s): Other (See Comments) Positive patch test Other reaction(s): Other (See Comments) Other reaction(s): Other (See Comments) Positive patch test Positive patch test Other reaction(s): Other (See Comments) Positive patch test Other reaction(s): Other (See Comments) Other reaction(s): Other (See Comments) Positive patch test Positive patch test Positive patch test Positive patch test  Other reaction(s): Other (See Comments) Positive patch test  Other reaction(s): Other (See Comments) Other reaction(s): Other (See Comments) Positive patch test Positive patch test  Positive patch test  Other reaction(s): Other (See Comments) Positive patch test Other reaction(s): Other (See Comments) Other reaction(s): Other (See Comments) Positive patch test Positive  patch test Positive patch test    Other Other (See Comments)     Other reaction(s): Other (See Comments) Para-phenylenediamine = Positive patch test amidoamine = Positive patch test      Other reaction(s): Other (See Comments) Para-phenylenediamine = Positive patch test amidoamine = Positive patch test Para-phenylenediamine = Positive patch test amidoamine = Positive patch test      Para-phenylenediamine = Positive patch test amidoamine = Positive patch test    Urea Other (See Comments)     Other reaction(s): Other (See Comments)   Positive patch test   Other reaction(s): Other (See Comments)   Other reaction(s): Other (See Comments)   Positive patch test   Positive patch test   Other reaction(s): Other (See Comments)   Positive patch test   Other reaction(s): Other (See Comments)   Other reaction(s): Other (See Comments)   Positive patch test   Positive patch test   Positive patch test   Positive patch test    Other reaction(s): Other (See Comments)   Positive patch test    Other  reaction(s): Other (See Comments)   Other reaction(s): Other (See Comments)   Positive patch test   Positive patch test    Positive patch test    Other reaction(s): Other (See Comments)   Positive patch test   Other reaction(s): Other (See Comments)   Other reaction(s): Other (See Comments)   Positive patch test   Positive patch test   Positive patch test    Other reaction(s): Other (See Comments) Positive patch test      Other reaction(s): Other (See Comments) Other reaction(s): Other (See Comments) Positive patch test Positive patch test      Positive patch test    Other reaction(s): Other (See Comments) Positive patch test Other reaction(s): Other (See Comments) Other reaction(s): Other (See Comments) Positive patch test Positive patch test Other reaction(s): Other (See Comments) Positive patch test Other reaction(s): Other (See Comments) Other reaction(s): Other (See Comments) Positive patch test Positive patch test Positive patch test Positive patch test      Other reaction(s): Other (See Comments) Positive patch test Other reaction(s): Other (See Comments) Other reaction(s): Other (See Comments) Positive patch test Positive patch test Positive patch test      Other reaction(s): Other (See Comments) Positive patch test Other reaction(s): Other (See Comments) Other reaction(s): Other (See Comments) Positive patch test Positive patch test Other reaction(s): Other (See Comments) Positive patch test Other reaction(s): Other (See Comments) Other reaction(s): Other (See Comments) Positive patch test Positive patch test Positive patch test Positive patch test  Other reaction(s): Other (See Comments) Positive patch test  Other reaction(s): Other (See Comments) Other reaction(s): Other (See Comments) Positive patch test Positive patch test  Positive patch test  Other reaction(s): Other (See Comments) Positive patch test Other reaction(s): Other (See Comments) Other reaction(s): Other (See Comments) Positive patch test  Positive patch test Positive patch test    Zolpidem Other (See Comments)     Sleep Walking   Other reaction(s): Other (See Comments), Other (See Comments)   Sleep Walking   Sleep Walking   Other reaction(s): Other (See Comments)   Sleep Walking   Other reaction(s): Other (See Comments), Other (See Comments)   Sleep Walking   Sleep Walking   Sleep Walking   Sleep Walking    Sleep Walking    Other reaction(s): Other (See Comments), Other (See Comments)   Sleep Walking   Sleep Walking    Other reaction(s): Other (See  Comments)   Sleep Walking   Other reaction(s): Other (See Comments), Other (See Comments)   Sleep Walking   Sleep Walking   Sleep Walking    Sleep walking    Atomoxetine Palpitations    Betaine Rash    Chlorhexidine Rash     allergic contact dermatitis   allergic contact dermatitis   allergic contact dermatitis   allergic contact dermatitis   allergic contact dermatitis   allergic contact dermatitis   allergic contact dermatitis   allergic contact dermatitis    allergic contact dermatitis    allergic contact dermatitis   allergic contact dermatitis    allergic contact dermatitis   allergic contact dermatitis   allergic contact dermatitis   allergic contact dermatitis        Past Medical History:   Diagnosis Date    Allergic rhinitis     Anxiety     Headache     Hypotension         Past Surgical History:   Procedure Laterality Date    HYSTERECTOMY, VAGINAL      KNEE ARTHROPLASTY          Family History   Problem Relation Age of Onset    Transient ischemic attack  Mother     Cancer Mother     Neurofibromatosis Father     Cardiomyopathy Brother         Social History     Socioeconomic History    Marital status: Single     Spouse name: Not on file    Number of children: Not on file    Years of education: Not on file    Highest education level: Not on file   Occupational History    Not on file   Tobacco Use    Smoking status: Never    Smokeless tobacco: Never   Vaping Use    Vaping status: Never Used    Substance and Sexual Activity    Alcohol use: Not Currently    Drug use: Never    Sexual activity: Not Currently   Other Topics Concern    Not on file   Social History Narrative    Not on file     Social Determinants of Health     Financial Resource Strain: Not on file   Food Insecurity: Not on file   Transportation Needs: Not on file   Physical Activity: Not on file   Stress: Not on file   Social Connections: Not on file   Intimate Partner Violence: Not on file   Housing Stability: Not on file        BP 90/60   Pulse 70   Temp 97.8 F (36.6 C)   Wt 64 kg (141 lb)   SpO2 98%   BMI 22.08 kg/m      Physical Exam  Vitals and nursing note reviewed.   Constitutional:       General: She is not in acute distress.     Appearance: Normal appearance. She is normal weight. She is not ill-appearing or toxic-appearing.   HENT:      Head: Atraumatic.   Genitourinary:     Comments: Cervix absent, gray/ milky white discharge present in vaginal vault. No adnexal tenderness present  Skin:     General: Skin is warm and dry.      Capillary Refill: Capillary refill takes less than 2 seconds.   Neurological:      Mental Status: She is alert.  Catherine Bryant was seen today for other.    Diagnoses and all orders for this visit:    Vaginal discharge  -     Vaginitis Bacteria/Yeast Candida/Trich/GC/CT; Future       Discussed will F/U vaginal swab and urine sample and treat as indicated.    Return if symptoms worsen or fail to improve.           Delorse Limber, APRN - NP

## 2023-10-25 NOTE — Patient Instructions (Signed)
Discussed will F/U vaginal swab and urine sample and treat as indicated.

## 2023-10-26 ENCOUNTER — Encounter

## 2023-10-26 VITALS — BP 90/60 | HR 70 | Temp 97.80000°F | Wt 141.0 lb

## 2023-10-26 LAB — AMB POC URINALYSIS DIP STICK MANUAL W/O MICRO
Bilirubin, Urine, POC: NEGATIVE
Glucose, Urine, POC: NEGATIVE
Nitrite, Urine, POC: NEGATIVE
Specific Gravity, Urine, POC: 1.02 (ref 1.001–1.035)
Urobilinogen, POC: 0.2 mg/dL (ref <1.1)
pH, Urine, POC: 6 (ref 4.6–8.0)

## 2023-10-30 LAB — VAGINITIS-BACTERIA/YEAST-CANDIDA/TRICH/GC/CT
Candida Glabrata, NAA: NEGATIVE
Candida albicans, NAA: NEGATIVE
Chlamydia trachomatis, NAA: NEGATIVE
N. Gonorrhoeae Amplified: NEGATIVE
Trichomonas Vaginalis by NAA: NEGATIVE

## 2023-11-03 NOTE — Telephone Encounter (Signed)
Pt called wanting to discuss results, would like a cb

## 2024-02-02 ENCOUNTER — Encounter

## 2024-02-02 MED ORDER — MIDODRINE HCL 5 MG PO TABS
5 | ORAL_TABLET | Freq: Every day | ORAL | 0 refills | 30.00000 days | Status: AC
Start: 2024-02-02 — End: ?

## 2024-02-06 NOTE — Telephone Encounter (Signed)
 Letter mailed

## 2024-02-06 NOTE — Telephone Encounter (Signed)
 Letter and order mailed

## 2024-04-03 ENCOUNTER — Ambulatory Visit: Admit: 2024-04-03 | Discharge: 2024-04-03 | Payer: PRIVATE HEALTH INSURANCE | Primary: Physician Assistant

## 2024-04-03 DIAGNOSIS — T63481A Toxic effect of venom of other arthropod, accidental (unintentional), initial encounter: Principal | ICD-10-CM

## 2024-04-03 MED ORDER — METHYLPREDNISOLONE 4 MG PO TBPK
4 | ORAL_TABLET | ORAL | 0 refills | 6.00000 days | Status: AC
Start: 2024-04-03 — End: ?

## 2024-04-03 MED ORDER — CETIRIZINE HCL 10 MG PO TABS
10 | ORAL_TABLET | Freq: Every day | ORAL | 0 refills | Status: AC | PRN
Start: 2024-04-03 — End: 2024-04-10

## 2024-04-03 MED ORDER — DEXAMETHASONE SODIUM PHOSPHATE 10 MG/ML IJ SOLN
10 | Freq: Once | INTRAMUSCULAR | Status: AC
Start: 2024-04-03 — End: 2024-04-03
  Administered 2024-04-03: 21:00:00 10 mg via INTRAMUSCULAR

## 2024-04-03 NOTE — Progress Notes (Signed)
 Catherine Bryant (DOB:  [birth year]) is a 53 y.o. female, presents for evaluation of the following chief complaint(s):  Other (Possible bug bite on rt leg x Monday nite-swelling,warm to touch, painful,redness)        Subjective   SUBJECTIVE/OBJECTIVE:      The patient is a 53 y.o.female who  has a past medical history of Allergic rhinitis, Anxiety, Headache, and Hypotension. They present to the clinic today complaining of an insect bite that occurred 2 days ago on the right leg that occurred 2 days ago, over the past 2 days the surrounding area lateral to the right knee has become increasingly painful and swollen with erythema present. The type of insect is unknown to the patient, denies any other symptoms at this time.    BP 98/60   Pulse 55   Temp 97.8 F (36.6 C) (Oral)   Wt 65.2 kg (143 lb 12.8 oz)   SpO2 98%   BMI 22.52 kg/m      Allergies   Allergen Reactions    Prochlorperazine Other (See Comments) and Anaphylaxis     agitation    Cocamidopropyl Betaine Other (See Comments)     Other reaction(s): Other (See Comments)   Positive patch test   Other reaction(s): Other (See Comments)   Other reaction(s): Other (See Comments)   Positive patch test   Positive patch test   Other reaction(s): Other (See Comments)   Positive patch test   Other reaction(s): Other (See Comments)   Other reaction(s): Other (See Comments)   Positive patch test   Positive patch test   Positive patch test   Positive patch test    Other reaction(s): Other (See Comments)   Positive patch test    Other reaction(s): Other (See Comments)   Other reaction(s): Other (See Comments)   Positive patch test   Positive patch test    Positive patch test    Other reaction(s): Other (See Comments)   Positive patch test   Other reaction(s): Other (See Comments)   Other reaction(s): Other (See Comments)   Positive patch test   Positive patch test   Positive patch test    Other reaction(s): Other (See Comments) Positive patch test      Other reaction(s):  Other (See Comments) Other reaction(s): Other (See Comments) Positive patch test Positive patch test      Other reaction(s): Other (See Comments) Positive patch test Other reaction(s): Other (See Comments) Other reaction(s): Other (See Comments) Positive patch test Positive patch test Other reaction(s): Other (See Comments) Positive patch test Other reaction(s): Other (See Comments) Other reaction(s): Other (See Comments) Positive patch test Positive patch test Positive patch test Positive patch test      Other reaction(s): Other (See Comments) Positive patch test Other reaction(s): Other (See Comments) Other reaction(s): Other (See Comments) Positive patch test Positive patch test Positive patch test      Positive patch test      Other reaction(s): Other (See Comments) Positive patch test Other reaction(s): Other (See Comments) Other reaction(s): Other (See Comments) Positive patch test Positive patch test Other reaction(s): Other (See Comments) Positive patch test Other reaction(s): Other (See Comments) Other reaction(s): Other (See Comments) Positive patch test Positive patch test Positive patch test Positive patch test  Other reaction(s): Other (See Comments) Positive patch test  Other reaction(s): Other (See Comments) Other reaction(s): Other (See Comments) Positive patch test Positive patch test  Positive patch test  Other reaction(s): Other (See Comments) Positive patch test Other reaction(s):  Other (See Comments) Other reaction(s): Other (See Comments) Positive patch test Positive patch test Positive patch test    Formaldehyde Other (See Comments)     Other reaction(s): Other (See Comments)   Positive patch test   Other reaction(s): Other (See Comments)   Other reaction(s): Other (See Comments)   Positive patch test   Positive patch test   Other reaction(s): Other (See Comments)   Positive patch test   Other reaction(s): Other (See Comments)   Other reaction(s): Other (See Comments)   Positive patch test    Positive patch test   Positive patch test   Positive patch test    Other reaction(s): Other (See Comments)   Positive patch test    Other reaction(s): Other (See Comments)   Other reaction(s): Other (See Comments)   Positive patch test   Positive patch test    Positive patch test    Other reaction(s): Other (See Comments)   Positive patch test   Other reaction(s): Other (See Comments)   Other reaction(s): Other (See Comments)   Positive patch test   Positive patch test   Positive patch test    Other reaction(s): Other (See Comments) Positive patch test      Other reaction(s): Other (See Comments) Other reaction(s): Other (See Comments) Positive patch test Positive patch test      Other reaction(s): Other (See Comments) Positive patch test Other reaction(s): Other (See Comments) Other reaction(s): Other (See Comments) Positive patch test Positive patch test Other reaction(s): Other (See Comments) Positive patch test Other reaction(s): Other (See Comments) Other reaction(s): Other (See Comments) Positive patch test Positive patch test Positive patch test Positive patch test      Other reaction(s): Other (See Comments) Positive patch test Other reaction(s): Other (See Comments) Other reaction(s): Other (See Comments) Positive patch test Positive patch test Positive patch test      Positive patch test      Other reaction(s): Other (See Comments) Positive patch test Other reaction(s): Other (See Comments) Other reaction(s): Other (See Comments) Positive patch test Positive patch test Other reaction(s): Other (See Comments) Positive patch test Other reaction(s): Other (See Comments) Other reaction(s): Other (See Comments) Positive patch test Positive patch test Positive patch test Positive patch test  Other reaction(s): Other (See Comments) Positive patch test  Other reaction(s): Other (See Comments) Other reaction(s): Other (See Comments) Positive patch test Positive patch test  Positive patch test  Other reaction(s):  Other (See Comments) Positive patch test Other reaction(s): Other (See Comments) Other reaction(s): Other (See Comments) Positive patch test Positive patch test Positive patch test    Other Other (See Comments)     Other reaction(s): Other (See Comments) Para-phenylenediamine = Positive patch test amidoamine = Positive patch test      Other reaction(s): Other (See Comments) Para-phenylenediamine = Positive patch test amidoamine = Positive patch test Para-phenylenediamine = Positive patch test amidoamine = Positive patch test      Para-phenylenediamine = Positive patch test amidoamine = Positive patch test    Urea Other (See Comments)     Other reaction(s): Other (See Comments)   Positive patch test   Other reaction(s): Other (See Comments)   Other reaction(s): Other (See Comments)   Positive patch test   Positive patch test   Other reaction(s): Other (See Comments)   Positive patch test   Other reaction(s): Other (See Comments)   Other reaction(s): Other (See Comments)   Positive patch test   Positive patch test   Positive patch  test   Positive patch test    Other reaction(s): Other (See Comments)   Positive patch test    Other reaction(s): Other (See Comments)   Other reaction(s): Other (See Comments)   Positive patch test   Positive patch test    Positive patch test    Other reaction(s): Other (See Comments)   Positive patch test   Other reaction(s): Other (See Comments)   Other reaction(s): Other (See Comments)   Positive patch test   Positive patch test   Positive patch test    Other reaction(s): Other (See Comments) Positive patch test      Other reaction(s): Other (See Comments) Other reaction(s): Other (See Comments) Positive patch test Positive patch test      Positive patch test    Other reaction(s): Other (See Comments) Positive patch test Other reaction(s): Other (See Comments) Other reaction(s): Other (See Comments) Positive patch test Positive patch test Other reaction(s): Other (See Comments) Positive  patch test Other reaction(s): Other (See Comments) Other reaction(s): Other (See Comments) Positive patch test Positive patch test Positive patch test Positive patch test      Other reaction(s): Other (See Comments) Positive patch test Other reaction(s): Other (See Comments) Other reaction(s): Other (See Comments) Positive patch test Positive patch test Positive patch test      Other reaction(s): Other (See Comments) Positive patch test Other reaction(s): Other (See Comments) Other reaction(s): Other (See Comments) Positive patch test Positive patch test Other reaction(s): Other (See Comments) Positive patch test Other reaction(s): Other (See Comments) Other reaction(s): Other (See Comments) Positive patch test Positive patch test Positive patch test Positive patch test  Other reaction(s): Other (See Comments) Positive patch test  Other reaction(s): Other (See Comments) Other reaction(s): Other (See Comments) Positive patch test Positive patch test  Positive patch test  Other reaction(s): Other (See Comments) Positive patch test Other reaction(s): Other (See Comments) Other reaction(s): Other (See Comments) Positive patch test Positive patch test Positive patch test    Zolpidem Other (See Comments)     Sleep Walking   Other reaction(s): Other (See Comments), Other (See Comments)   Sleep Walking   Sleep Walking   Other reaction(s): Other (See Comments)   Sleep Walking   Other reaction(s): Other (See Comments), Other (See Comments)   Sleep Walking   Sleep Walking   Sleep Walking   Sleep Walking    Sleep Walking    Other reaction(s): Other (See Comments), Other (See Comments)   Sleep Walking   Sleep Walking    Other reaction(s): Other (See Comments)   Sleep Walking   Other reaction(s): Other (See Comments), Other (See Comments)   Sleep Walking   Sleep Walking   Sleep Walking    Sleep walking    Atomoxetine Palpitations    Betaine Rash    Chlorhexidine Rash     allergic contact dermatitis   allergic contact dermatitis    allergic contact dermatitis   allergic contact dermatitis   allergic contact dermatitis   allergic contact dermatitis   allergic contact dermatitis   allergic contact dermatitis    allergic contact dermatitis    allergic contact dermatitis   allergic contact dermatitis    allergic contact dermatitis   allergic contact dermatitis   allergic contact dermatitis   allergic contact dermatitis      Current Outpatient Medications   Medication Sig Dispense Refill    midodrine  (PROAMATINE ) 5 MG tablet Take 1 tablet by mouth daily 90 tablet 0  SUMAtriptan  (IMITREX ) 50 MG tablet Take 1 tablet by mouth once as needed for Migraine 9 tablet 0    ondansetron (ZOFRAN) 4 MG tablet Take by mouth      albuterol  sulfate HFA (VENTOLIN  HFA) 108 (90 Base) MCG/ACT inhaler Inhale 2 puffs into the lungs 4 times daily as needed for Wheezing 18 g 0    Azelastine HCl 137 MCG/SPRAY SOLN 1 spray by Nasal route 2 times daily      albuterol  sulfate HFA (PROVENTIL ;VENTOLIN ;PROAIR ) 108 (90 Base) MCG/ACT inhaler Inhale 2 puffs into the lungs every 6 hours as needed      cetirizine (ZYRTEC) 10 MG tablet Take 1 tablet by mouth daily      vitamin D (CHOLECALCIFEROL) 25 MCG (1000 UT) TABS tablet Take 1 tablet by mouth daily      traZODone (DESYREL) 50 MG tablet Take 1 tablet by mouth nightly (Patient not taking: Reported on 04/03/2024)      fluticasone (FLONASE) 50 MCG/ACT nasal spray 2 sprays by Nasal route daily      ibuprofen (ADVIL;MOTRIN) 200 MG tablet Take 1 tablet by mouth every 6 hours as needed       No current facility-administered medications for this visit.      Past Medical History:   Diagnosis Date    Allergic rhinitis     Anxiety     Headache     Hypotension       Past Surgical History:   Procedure Laterality Date    HYSTERECTOMY, VAGINAL      KNEE ARTHROPLASTY        Family History   Problem Relation Age of Onset    Transient ischemic attack  Mother     Cancer Mother     Neurofibromatosis Father     Cardiomyopathy Brother       Social  History     Occupational History    Not on file   Tobacco Use    Smoking status: Never    Smokeless tobacco: Never   Vaping Use    Vaping status: Never Used   Substance and Sexual Activity    Alcohol use: Not Currently    Drug use: Never    Sexual activity: Not Currently          Review of Systems   Constitutional:  Negative for chills, diaphoresis, fever and unexpected weight change.   HENT: Negative.     Eyes: Negative.  Negative for visual disturbance.   Respiratory:  Negative for chest tightness and shortness of breath.    Cardiovascular:  Negative for chest pain, palpitations and leg swelling.   Gastrointestinal:  Negative for abdominal distention and abdominal pain.   Genitourinary: Negative.  Negative for dysuria and flank pain.   Musculoskeletal: Negative.  Negative for gait problem, neck pain and neck stiffness.   Skin:  Negative for color change.        RLE pain, erythema   Neurological: Negative.  Negative for dizziness, syncope, weakness, light-headedness, numbness and headaches.   Psychiatric/Behavioral: Negative.  Negative for confusion.           Objective   Physical Exam  Vitals and nursing note reviewed.   Constitutional:       General: She is not in acute distress.     Appearance: Normal appearance. She is not toxic-appearing or diaphoretic.   HENT:      Head: Atraumatic.      Nose: Nose normal.      Mouth/Throat:  Mouth: Mucous membranes are moist.      Pharynx: No oropharyngeal exudate or posterior oropharyngeal erythema.   Eyes:      Conjunctiva/sclera: Conjunctivae normal.      Pupils: Pupils are equal, round, and reactive to light.   Cardiovascular:      Rate and Rhythm: Normal rate and regular rhythm.      Pulses: Normal pulses.      Heart sounds: Normal heart sounds.   Pulmonary:      Effort: Pulmonary effort is normal. No respiratory distress.      Breath sounds: Normal breath sounds. No wheezing or rales.   Chest:      Chest wall: No tenderness.   Abdominal:      General: Bowel sounds  are normal. There is no distension.      Palpations: Abdomen is soft. There is no mass.      Tenderness: There is no abdominal tenderness.   Musculoskeletal:         General: No deformity. Normal range of motion.      Cervical back: Normal range of motion.   Skin:     General: Skin is warm and dry.      Capillary Refill: Capillary refill takes less than 2 seconds.      Coloration: Skin is not pale.      Findings: Erythema present.             Comments: 10 cm area of erythema and mild swelling surrounding a centralized ,closed insect bite/sting puncture wound. No signs of secondary bacterial infection.   Neurological:      General: No focal deficit present.      Mental Status: She is alert and oriented to person, place, and time. Mental status is at baseline.      Sensory: No sensory deficit.      Motor: No weakness.      Coordination: Coordination normal.      Gait: Gait normal.   Psychiatric:         Mood and Affect: Mood normal.         Behavior: Behavior normal.                 ASSESSMENT/PLAN:  Assessment & Plan  Local reaction to insect sting, accidental or unintentional, initial encounter       Orders:    dexAMETHasone (DECADRON) IntraMUSCular 10 mg    cetirizine (ZYRTEC) 10 MG tablet; Take 1 tablet by mouth daily as needed for Allergies    methylPREDNISolone (MEDROL, PAK,) 4 MG tablet; Take by mouth as directed on blister pack.    Large localized reaction to insect sting with significant symptoms, no sign of secondary bacterial infection or systemic allergic reaction.    Medication side effects and administration instructions reviewed with the patient. RTC instructions reviewed with the patient.        No follow-ups on file.           An electronic signature was used to authenticate this note.    --Nancyann JONETTA Lin, FNP

## 2024-04-03 NOTE — Telephone Encounter (Signed)
 Letter mailed

## 2024-04-03 NOTE — Patient Instructions (Addendum)
 usually begin several hours after a sting, peak at 24 to 48 hours, and then gradually resolve over 5 to 10 days.     Take Medrol dose pack per blister pack instructions.    Take Zyrtec 10mg  once daily until symptom resolution.    May take ibuprofen, tylenol, and use ice for discomfort.    Seek immediate medical attention for any new or worsening symptoms.    Monitor for signs of secondary bacterial infection as discussed.
